# Patient Record
Sex: Female | Born: 1970 | Race: White | Hispanic: No | Marital: Married | Smoking: Never smoker
Health system: Southern US, Community
[De-identification: ages and names within clinical notes are randomized; demographics above are authoritative.]

## PROBLEM LIST (undated history)

## (undated) DIAGNOSIS — R413 Other amnesia: Secondary | ICD-10-CM

## (undated) DIAGNOSIS — J302 Other seasonal allergic rhinitis: Secondary | ICD-10-CM

## (undated) DIAGNOSIS — R011 Cardiac murmur, unspecified: Secondary | ICD-10-CM

## (undated) HISTORY — DX: Other seasonal allergic rhinitis: J30.2

## (undated) HISTORY — PX: LAPAROSCOPY: SHX197

## (undated) HISTORY — PX: KNEE SURGERY: SHX244

## (undated) HISTORY — PX: OTHER SURGICAL HISTORY: SHX169

## (undated) HISTORY — DX: Other amnesia: R41.3

## (undated) HISTORY — PX: ARTHROSCOPIC REPAIR ACL: SUR80

## (undated) HISTORY — PX: TONSILLECTOMY: SHX5217

## (undated) HISTORY — DX: Cardiac murmur, unspecified: R01.1

---

## 1998-02-25 ENCOUNTER — Other Ambulatory Visit: Admission: RE | Admit: 1998-02-25 | Discharge: 1998-02-25 | Payer: Self-pay | Admitting: Obstetrics and Gynecology

## 2000-09-05 ENCOUNTER — Inpatient Hospital Stay (HOSPITAL_COMMUNITY): Admission: EM | Admit: 2000-09-05 | Discharge: 2000-09-07 | Payer: Self-pay | Admitting: Emergency Medicine

## 2000-09-05 ENCOUNTER — Encounter: Payer: Self-pay | Admitting: Orthopedic Surgery

## 2000-09-05 ENCOUNTER — Encounter: Payer: Self-pay | Admitting: Emergency Medicine

## 2004-06-12 ENCOUNTER — Encounter: Admission: RE | Admit: 2004-06-12 | Discharge: 2004-06-12 | Payer: Self-pay | Admitting: Family Medicine

## 2008-08-23 ENCOUNTER — Ambulatory Visit: Payer: Self-pay | Admitting: Internal Medicine

## 2008-08-23 ENCOUNTER — Ambulatory Visit: Payer: Self-pay | Admitting: Infectious Diseases

## 2008-08-23 ENCOUNTER — Observation Stay (HOSPITAL_COMMUNITY): Admission: EM | Admit: 2008-08-23 | Discharge: 2008-08-23 | Payer: Self-pay | Admitting: Emergency Medicine

## 2010-08-02 IMAGING — CT CT ANGIO CHEST
2 of 7 series · 19 of 36 positions shown · IV contrast (APPLIED)
Comparison: Plain film of earlier in the morning.

CLINICAL DATA: Chest pain.  Shortness of breath.

CT Angiography of the Chest.
TECHNIQUE: Multidetector CT angiography of the chest was performed
after contrast with bolus timed to evaluate the pulmonary arteries.
Multiplanar CT image reconstructions including MIPs were obtained
to evaluate the vascular anatomy.
Contrast:  67 ml Omnipaque 350

[Series 8: pulm embolism 1.0 b25f thins · axial · 0.63mm/px · z∈[-276,-38]mm · 18 of 267 slices shown]
[im 14/267  lung]
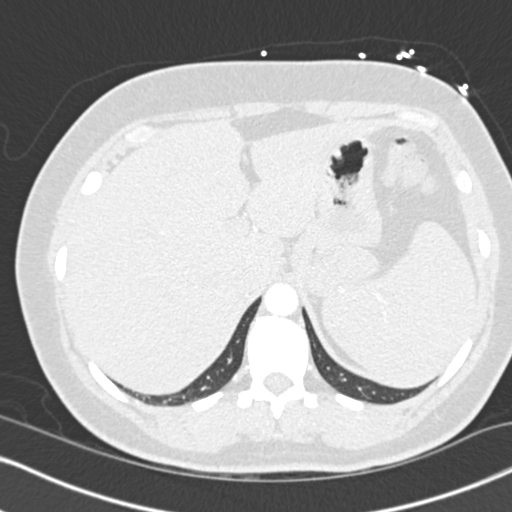
[im 27/267  mediastinal]
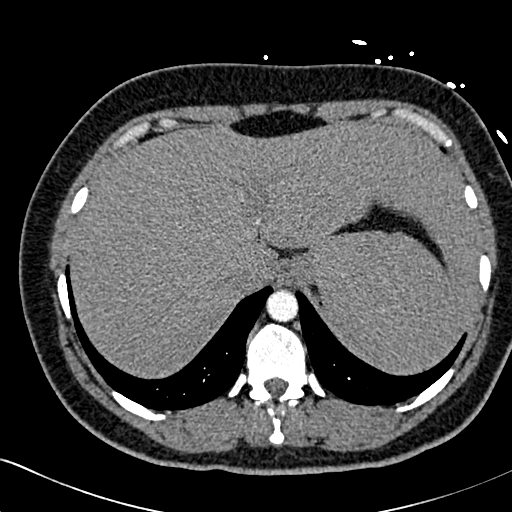
[im 40/267  lung]
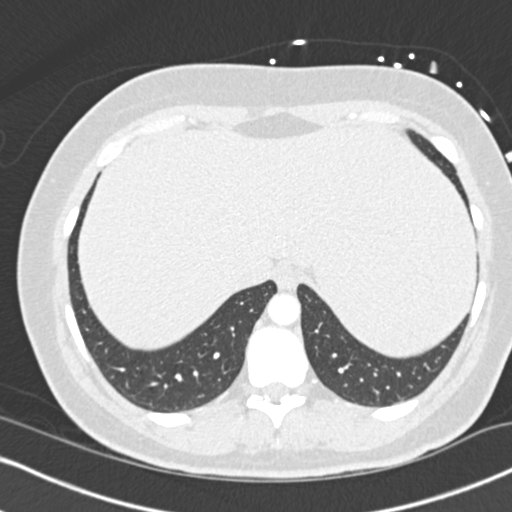
[im 54/267  mediastinal]
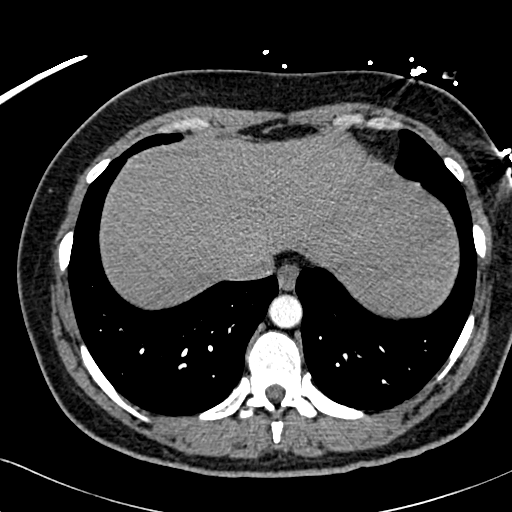
[im 67/267  lung]
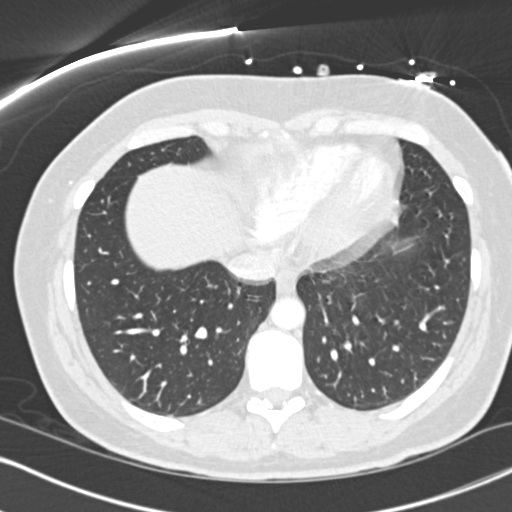
[im 80/267  mediastinal]
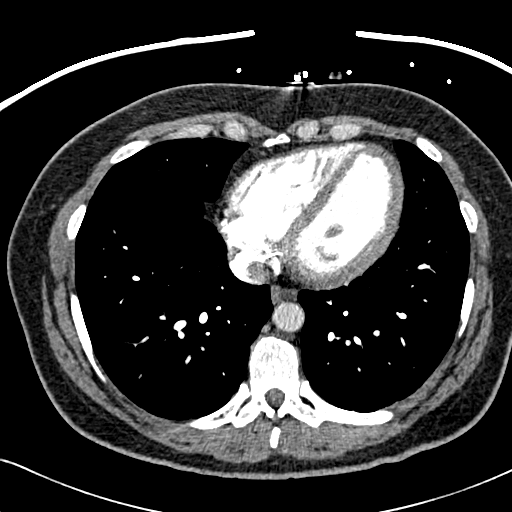
[im 94/267  lung]
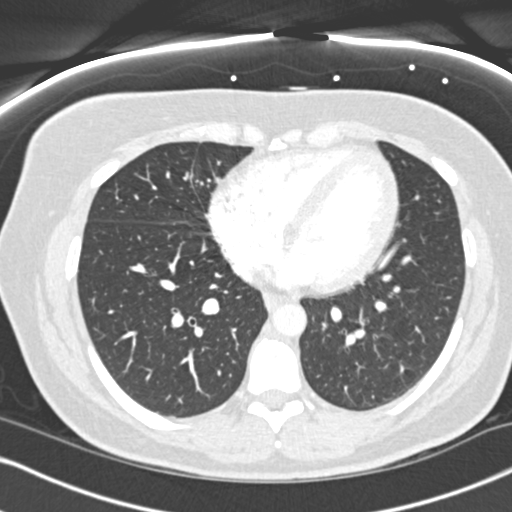
[im 107/267  mediastinal]
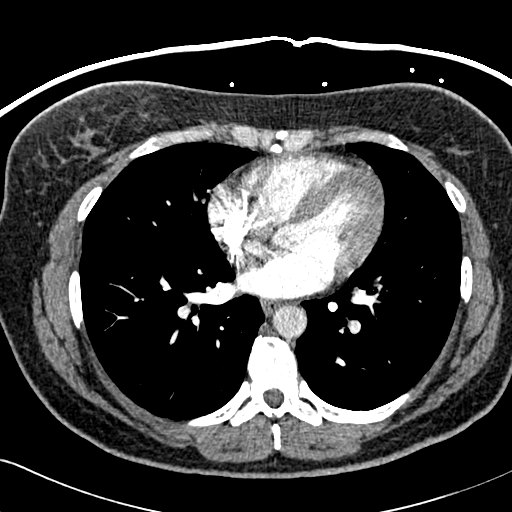
[im 120/267  lung]
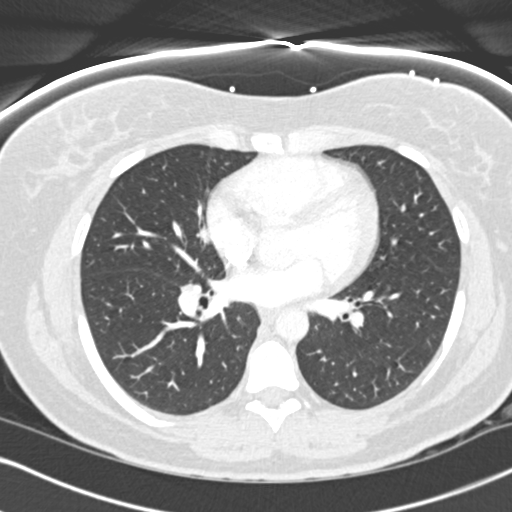
[im 147/267  mediastinal]
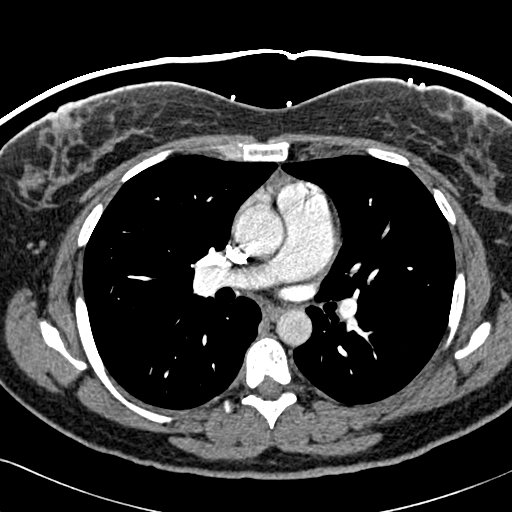
[im 160/267  lung]
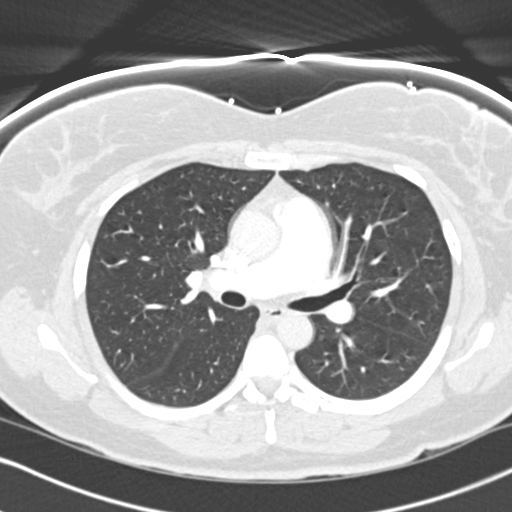
[im 173/267  mediastinal]
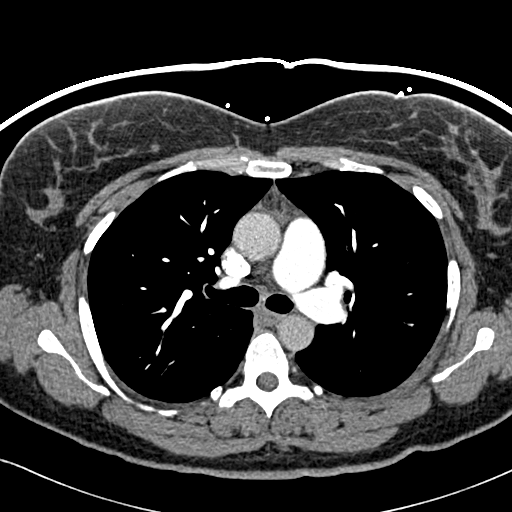
[im 187/267  lung]
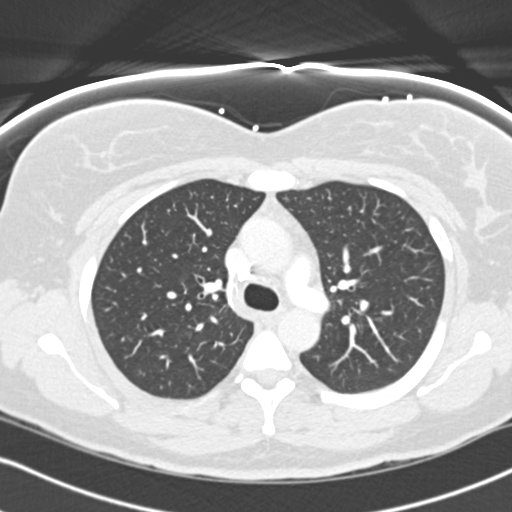
[im 200/267  mediastinal]
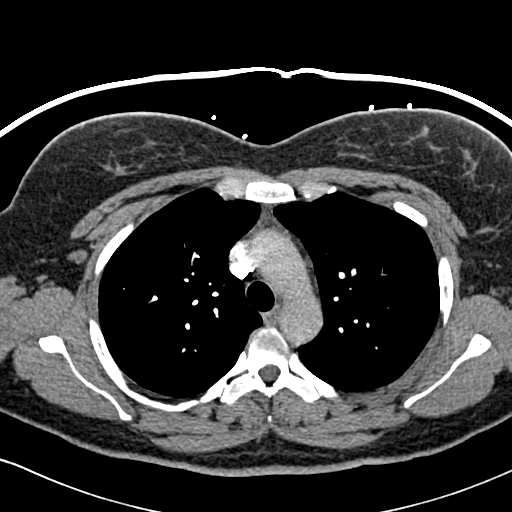
[im 213/267  lung]
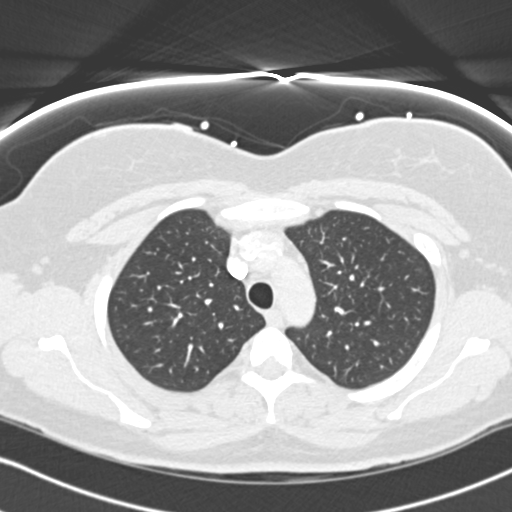
[im 227/267  mediastinal]
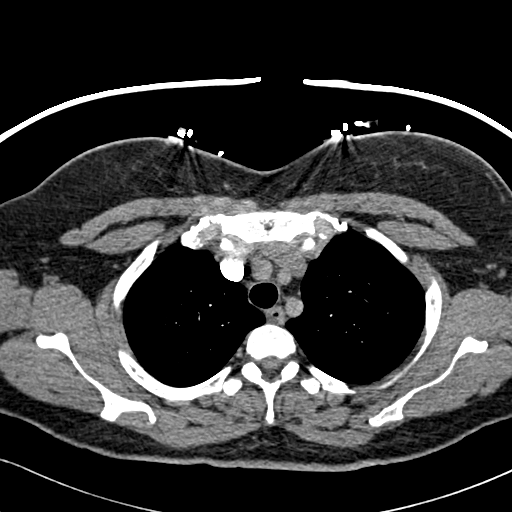
[im 240/267  lung]
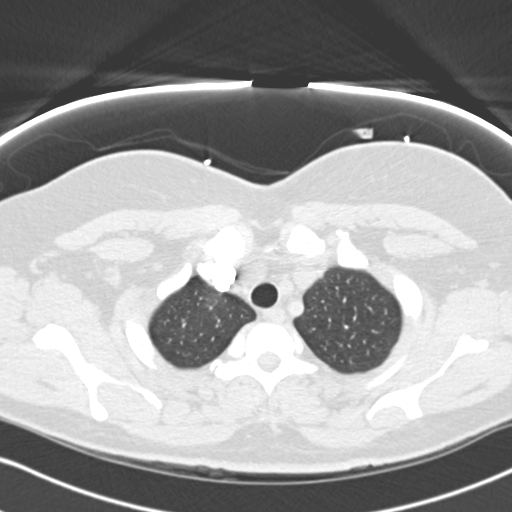
[im 253/267  mediastinal]
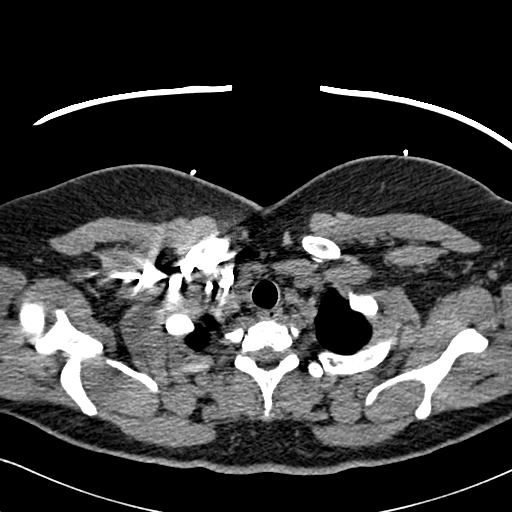

[Series 602: cor · coronal · 0.63mm/px · 1 of 102 slices shown]
[im 51/102  mediastinal]
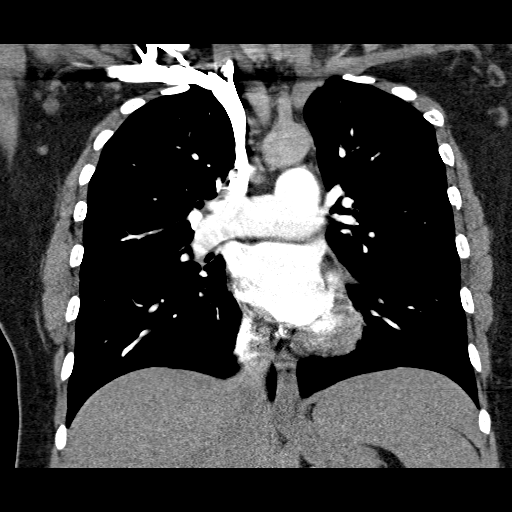

[19 of 36 positions shown; findings below may reference images not displayed]

FINDINGS: Lung windows demonstrate no nodules, airspace opacities.

Soft tissue windows:  The quality of this exam for evaluation of
pulmonary embolism is good.  The upper lobe pulmonary arteries are
extremely well opacified.  The lower lobe pulmonary arteries are
moderately well opacified.  No evidence of upper lobe pulmonary
embolism.  Small segmental emboli cannot be excluded to the lower
lobes.

Normal heart size without pericardial or pleural effusion.  No
mediastinal or hilar adenopathy.

Limited abdominal imaging demonstrates prominent left lobe of the
liver, a normal variant. No acute osseous abnormality.

 Review of the MIP images confirms the above findings.
IMPRESSION: 1. No evidence of pulmonary embolism.  Mildly limited evaluation of
the lower lobes.
2. No acute process in the chest.

## 2010-08-23 LAB — TROPONIN I
Troponin I: <0.01 ng/mL (ref 0.00–0.06)
Troponin I: <0.01 ng/mL (ref 0.00–0.06)

## 2010-08-23 LAB — CBC
HCT: 41.5 % (ref 36.0–46.0)
Hemoglobin: 13.9 g/dL (ref 12.0–15.0)
MCHC: 33.5 g/dL (ref 30.0–36.0)
MCHC: 34.9 g/dL (ref 30.0–36.0)
MCV: 85.5 fL (ref 78.0–100.0)
Platelets: 212 10*3/uL (ref 150–400)
RBC: 4.53 MIL/uL (ref 3.87–5.11)
RBC: 4.86 MIL/uL (ref 3.87–5.11)
WBC: 9.3 10*3/uL (ref 4.0–10.5)

## 2010-08-23 LAB — BASIC METABOLIC PANEL
CO2: 20 mEq/L (ref 19–32)
Chloride: 103 mEq/L (ref 96–112)
Creatinine, Ser: 0.88 mg/dL (ref 0.4–1.2)
GFR calc Af Amer: >60 mL/min (ref >60)
Potassium: 3.5 mEq/L (ref 3.5–5.1)

## 2010-08-23 LAB — POCT CARDIAC MARKERS

## 2010-08-23 LAB — LIPID PANEL
HDL: 33 mg/dL — ABNORMAL LOW (ref >39)
Total CHOL/HDL Ratio: 3.8 RATIO
Triglycerides: 59 mg/dL (ref <150)

## 2010-08-23 LAB — APTT: aPTT: 32 seconds (ref 24–37)

## 2010-08-23 LAB — MAGNESIUM: Magnesium: 1.9 mg/dL (ref 1.5–2.5)

## 2010-08-23 LAB — CK TOTAL AND CKMB (NOT AT ARMC)
Relative Index: INVALID (ref 0.0–2.5)
Total CK: 54 U/L (ref 7–177)

## 2010-08-23 LAB — POCT I-STAT, CHEM 8
Calcium, Ion: 0.99 mmol/L — ABNORMAL LOW (ref 1.12–1.32)
Glucose, Bld: 94 mg/dL (ref 70–99)
HCT: 43 % (ref 36.0–46.0)
TCO2: 18 mmol/L (ref 0–100)

## 2010-08-23 LAB — PROTIME-INR
INR: 1.1 (ref 0.00–1.49)
Prothrombin Time: 14.3 seconds (ref 11.6–15.2)

## 2010-08-23 LAB — D-DIMER, QUANTITATIVE: D-Dimer, Quant: 1.15 ug/mL-FEU — ABNORMAL HIGH (ref 0.00–0.48)

## 2010-09-26 NOTE — H&P (Signed)
NAMEALLURE, GREASER NO.:  [id number]   MEDICAL RECORD NO.:  [id number]          PATIENT TYPE:  INP   LOCATION:  5504                         FACILITY:  [facility]   PHYSICIAN:  Mick Sell, MD DATE OF BIRTH:  [birth year]   DATE OF ADMISSION:  08/22/2008  DATE OF DISCHARGE:                              HISTORY & PHYSICAL   PRIMARY CARE DOCTOR:  [facility].   CHIEF COMPLAINT:  Chest pain.   HISTORY OF PRESENT ILLNESS:  This is a 40 year old white female with no  significant past medical history who is a nonsmoker who was admitted for  chest pain on and off for 1 day.  She says this morning she felt poorly,  but by 4:30 p.m. developed chest pressure 7/10 at the worst, felt like  an elephant sitting on her chest associated with shortness of breath,  worse with exertion and also had some nausea and left arm pain and neck  pain.  She also felt dizzy at 1 point.  The pain was relatively constant  at 5/10 and at its worse was 7/10.  She came to the emergency room where  her chest pain was relieved with sublingual nitroglycerin.  She also had  occasional palpitations she reports.  She has no cough.  No lower  extremity swelling.  She had no recent illnesses except gastroenteritis  4 weeks ago.  She did return 1 week ago from a 14-hour trip to [place]  at [facility].  No sick contacts.   PAST MEDICAL HISTORY:  1. Heart murmur.  2. Knee surgery.  3. She has 2 children who are healthy.   SOCIAL HISTORY:  She lives with her kids and her husband.  She does not  smoke, drinks occasional alcohol and works as a Advertising account planner.   FAMILY HISTORY:  Her daughter has SVT, but other than that, she does not  know her history since she is adopted.   REVIEW OF SYSTEMS:  Eleven systems are reviewed and negative except as  per HPI.   PHYSICAL EXAMINATION:  Temperature 98.8, pulse 55-67, blood pressure  93/56 to 112/64, respirations 18-20, saturating 100%  on room air.  In general, she is a pleasant woman in no acute distress.  Pupils equal,  round and reactive to light and accommodation.  Extraocular movements  are intact.  Sclerae are anicteric.  NECK:  Is supple.  HEART:  Is regular rate and rhythm with no murmurs, rubs or gallops  noted.  LUNGS:  Clear to auscultation bilaterally.  ABDOMEN:  Soft, nontender, nondistended.  No clubbing, cyanosis or  edema.  Negative Homans' sign.  NEUROLOGICALLY:  She is grossly intact  day.   LABORATORY DATA:  The patient had blood work done in the emergency room  with a BMET sodium 132, potassium 3.5, chloride 103, CO2 20, glucose 96,  BUN 16, creatinine 0.88, calcium was 8.4.  Coags were within normal  limits.  CK was 66, MB was 0.7, troponin was less than 0.01.  Magnesium  was 1.9.  BNP was 44.  D-dimer was positive at 1.15.  CBC with white  count of 10.6, hemoglobin 13.9, platelets 223.  Chest x-ray was done and  revealed no acute abnormality.  CT of her chest is pending.  EKG was  normal sinus rhythm with normal axis and intervals.   IMPRESSION:  This is a 40 year old female with no past medical history  with acute onset of exertional chest pain, relieved when she came to the  emergency room with sublingual nitroglycerin.  She has no cardiac risk  factors, but her family history is unknown.  She did have a recent  prolonged car trip and has an elevated D-dimer.   PLAN:  1. Will admit her to telemetry and rule her out for an MI with cardiac      enzymes.  Will check a CT angio to evaluate for pulmonary embolism.      Will place her on aspirin and sublingual nitro p.r.n.  We will hold      on beta blocker given that her blood pressure is borderline.  We      may consider cardiology consult in the morning given the      possibility of coronary artery disease.  2. Place her on Lovenox for DVT prophylaxis.  3. Disposition.  The patient can return home once stable.      Mick Sell,  MD  Electronically Signed     DPF/MEDQ  D:  08/23/2008  T:  08/23/2008  Job:  [id number]
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
## 2011-05-21 ENCOUNTER — Ambulatory Visit: Payer: Self-pay | Admitting: Gynecology

## 2011-10-10 ENCOUNTER — Ambulatory Visit: Payer: BC Managed Care – PPO | Admitting: Obstetrics and Gynecology

## 2011-10-16 ENCOUNTER — Encounter: Payer: Self-pay | Admitting: Obstetrics and Gynecology

## 2011-10-16 ENCOUNTER — Ambulatory Visit (INDEPENDENT_AMBULATORY_CARE_PROVIDER_SITE_OTHER): Payer: BC Managed Care – PPO | Admitting: Obstetrics and Gynecology

## 2011-10-16 VITALS — BP 100/70 | HR 74 | Ht 68.0 in | Wt 205.0 lb

## 2011-10-16 DIAGNOSIS — Z124 Encounter for screening for malignant neoplasm of cervix: Secondary | ICD-10-CM

## 2011-10-16 DIAGNOSIS — N946 Dysmenorrhea, unspecified: Secondary | ICD-10-CM

## 2011-10-16 DIAGNOSIS — Z01419 Encounter for gynecological examination (general) (routine) without abnormal findings: Secondary | ICD-10-CM

## 2011-10-16 MED ORDER — IBUPROFEN 800 MG PO TABS: 800.0000 mg | ORAL_TABLET | Freq: Three times a day (TID) | ORAL | Status: AC | PRN

## 2011-10-16 NOTE — Progress Notes (Signed)
Regular Periods: yes Mammogram: yes  Monthly Breast Ex.: yes Exercise: yes  Tetanus < 10 years: no Seatbelts: yes  NI. Bladder Functn.: yes Abuse at home: no  Daily BM's: yes Stressful Work: yes  Healthy Diet: yes Sigmoid-Colonoscopy: NO  Calcium: no Medical problems this year: NO PROBLEMS   LAST PAP:2 YEARS AGO NL  Contraception: SPOUSE HAS VAS.  Mammogram:  9/12 MARKER IN RIGHT BREAST  PCP: NOELLE REDMON  PMH: NO CHANGE  [facility]: NO CHANGE  Last Bone Scan: NO   PT IS ADOPTED AND MARRIED

## 2011-10-16 NOTE — Progress Notes (Signed)
Subjective:    Stacy Mclaughlin is a 41 y.o. female, G3P2013, who presents for an annual exam. The patient reports no complaints.   Menstrual cycle:   LMP: Patient's last menstrual period was 10/12/2011.             Review of Systems Pertinent items are noted in HPI. Denies pelvic pain, urinary tract symptoms, vaginitis symptoms, irregular bleeding, menopausal symptoms, change in bowel habits or rectal bleeding   Objective:    BP 100/70  Pulse 74  Ht 5\' 8"  (1.727 m)  Wt 205 lb (92.987 kg)  BMI 31.17 kg/m2  LMP 10/12/2011   @Weigh @t :  Wt Readings from Last 1 Encounters:  10/16/11 205 lb (92.987 kg)   Body mass index is 31.17 kg/(m^2). General Appearance: Alert, no acute distress HEENT: Grossly normal Neck / Thyroid: Supple, no thyromegaly or cervical adenopathy Lungs: Clear to auscultation bilaterally Back: No CVA tenderness Breast Exam: No masses or nodes.No dimpling, nipple retraction or discharge. Cardiovascular: Regular rate and rhythm.  Gastrointestinal: Soft, non-tender, no masses or organomegaly Pelvic Exam: EGBUS-wnl, vagina-normal rugae, cervix- without lesions or tenderness, uterus appears normal size shape and consistency, adnexae-no masses or tenderness Rectovaginal: no masses and normal sphincter tone Lymphatic Exam: Non-palpable nodes in neck, clavicular,  axillary, or inguinal regions  Skin: no rashes or abnormalities Extremities: no clubbing cyanosis or edema  Neurologic: grossly normal Psychiatric: Alert and oriented   Assessment:   Routine GYN Exam Dysmenorrhea   Plan:  Ibuprofen 800 mg # 30 1 po pc q 8 hours prn-pain  2 refills  Calcium/Magnesium/Vitamin D daily  PAP sent  RTO 1 year or prn  Jerimah Witucki,ELMIRAPA-C

## 2011-10-16 NOTE — Patient Instructions (Signed)
Take Calcium with Magnesium and Vitamin D

## 2011-10-18 LAB — PAP IG W/ RFLX HPV ASCU

## 2013-07-30 ENCOUNTER — Other Ambulatory Visit: Payer: Self-pay | Admitting: Family Medicine

## 2013-07-30 ENCOUNTER — Other Ambulatory Visit: Payer: Self-pay | Admitting: Physician Assistant

## 2013-07-30 ENCOUNTER — Ambulatory Visit
Admission: RE | Admit: 2013-07-30 | Discharge: 2013-07-30 | Disposition: A | Discharge status: Home / Self Care | Payer: BC Managed Care – PPO | Source: Ambulatory Visit | Attending: Family Medicine | Admitting: Family Medicine

## 2013-07-30 DIAGNOSIS — S0990XA Unspecified injury of head, initial encounter: Secondary | ICD-10-CM

## 2013-07-30 DIAGNOSIS — R51 Headache: Principal | ICD-10-CM

## 2013-07-30 DIAGNOSIS — R519 Headache, unspecified: Secondary | ICD-10-CM

## 2014-03-15 ENCOUNTER — Encounter: Payer: Self-pay | Admitting: Obstetrics and Gynecology

## 2014-08-11 ENCOUNTER — Encounter: Payer: Self-pay | Admitting: Neurology

## 2014-08-17 ENCOUNTER — Encounter: Payer: Self-pay | Admitting: Neurology

## 2014-08-17 ENCOUNTER — Ambulatory Visit (INDEPENDENT_AMBULATORY_CARE_PROVIDER_SITE_OTHER): Payer: BC Managed Care – PPO | Admitting: Neurology

## 2014-08-17 VITALS — BP 118/70 | HR 70 | Temp 98.6°F | Resp 18 | Ht 68.0 in | Wt 220.8 lb

## 2014-08-17 DIAGNOSIS — R413 Other amnesia: Secondary | ICD-10-CM | POA: Diagnosis not present

## 2014-08-17 DIAGNOSIS — Z87820 Personal history of traumatic brain injury: Secondary | ICD-10-CM
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
## 2014-08-17 NOTE — Patient Instructions (Signed)
You scored very well on your cognitive exam.  Physical exam is normal.  I don't appreciate any cognitive impairment.  If it is a possible issue of trouble focusing or due to having too much on your plate, consider seeing a psychiatrist to treat that.  If symptoms persist or get worse, you may follow up in one year for re-evaluation.

## 2015-07-09 IMAGING — CT CT HEAD W/O CM
2 series · 16 of 30 positions shown, 18 images · non-contrast
Comparison: None.

CLINICAL DATA: Pain post trauma

EXAM:
CT HEAD WITHOUT CONTRAST
TECHNIQUE: Contiguous axial images were obtained from the base of the skull
through the vertex without intravenous contrast.

[Series 3: head bone · axial · 0.49mm/px · z∈[+31,+159]mm · 8 of 64 slices shown]
[im 7/64  bone]
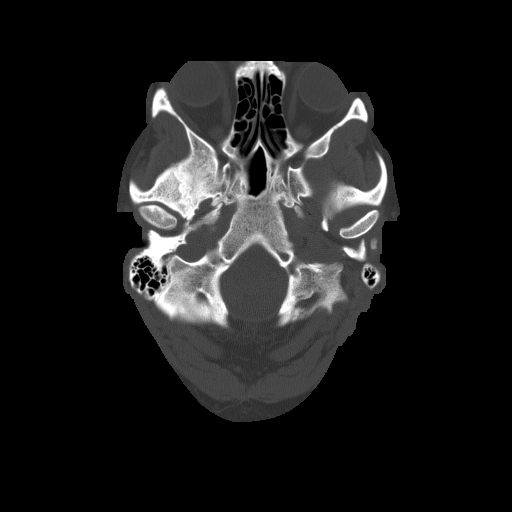
[im 14/64  bone]
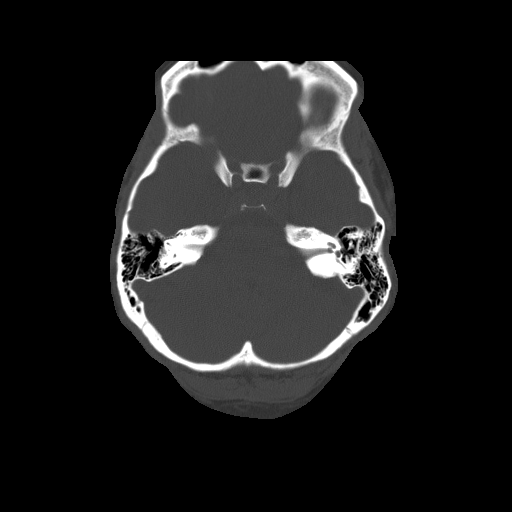
[im 20/64  bone]
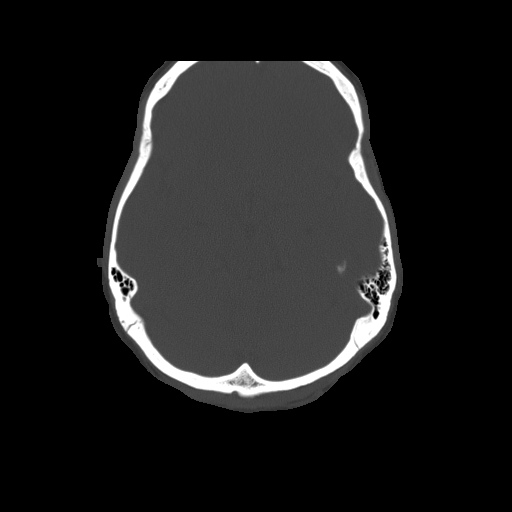
[im 27/64  bone]
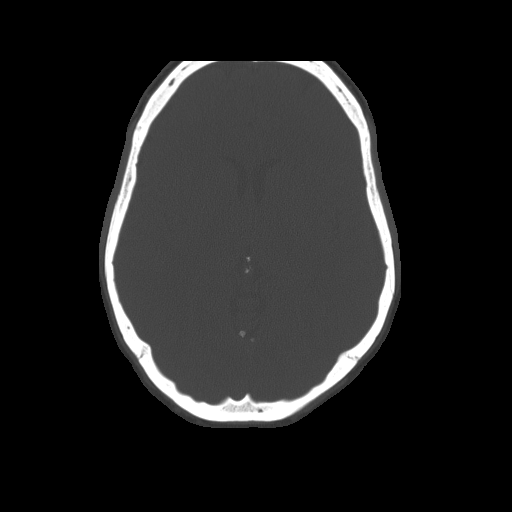
[im 37/64  bone]
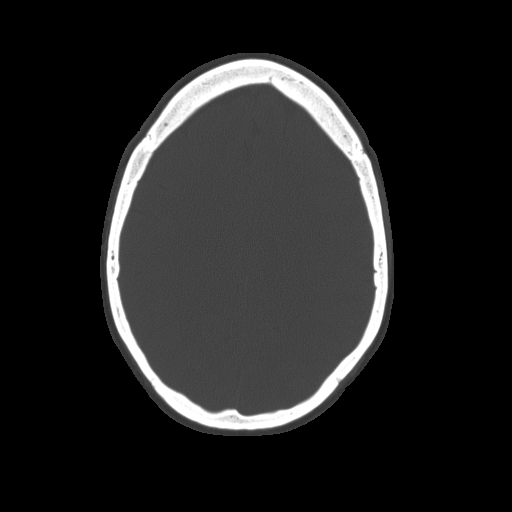
[im 44/64  bone]
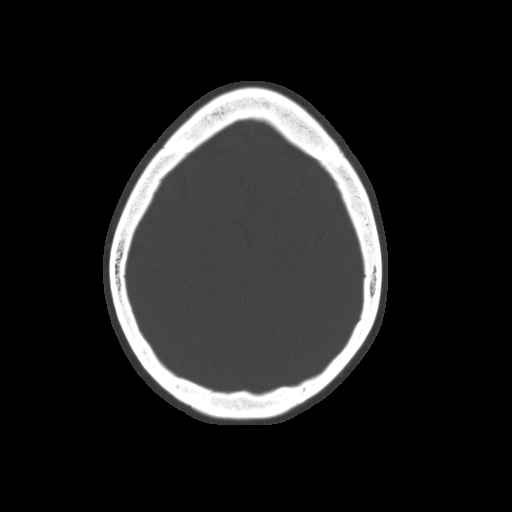
[im 50/64  bone]
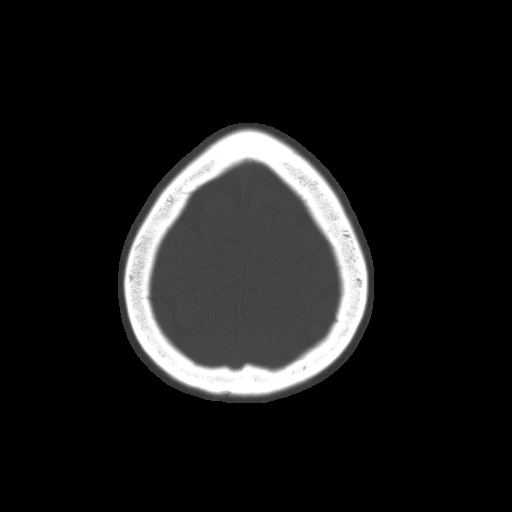
[im 57/64  bone]
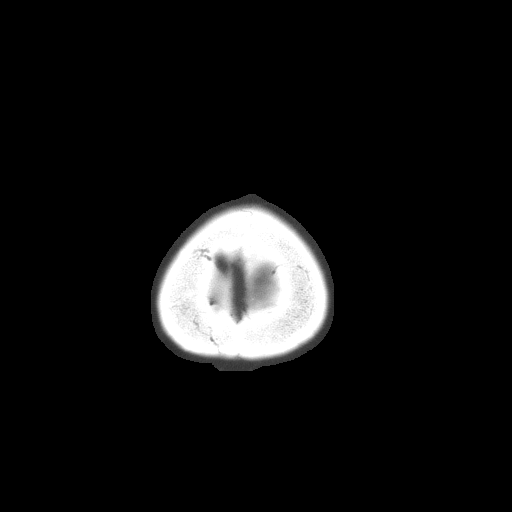

[Series 32: 3d filtered head w/o · axial · non-contrast · 0.49mm/px · z∈[+32,+155]mm · 8 of 32 slices shown, 10 images]
[im 4/32  brain]
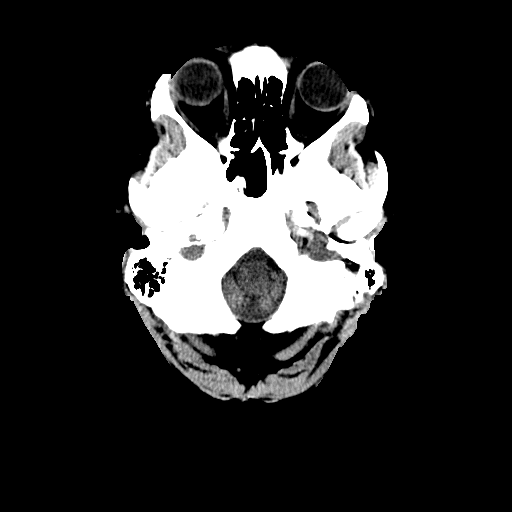
[im 4/32  bone]
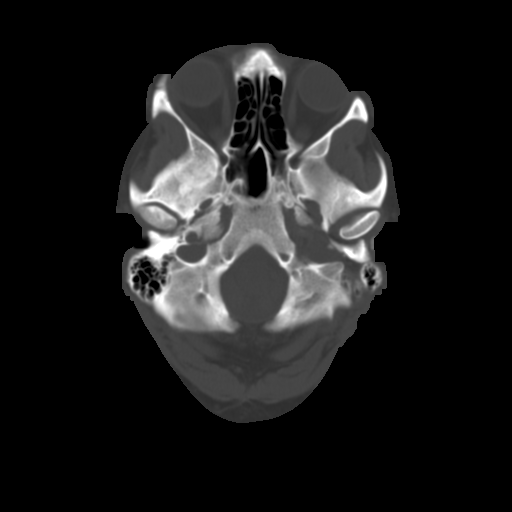
[im 7/32  brain]
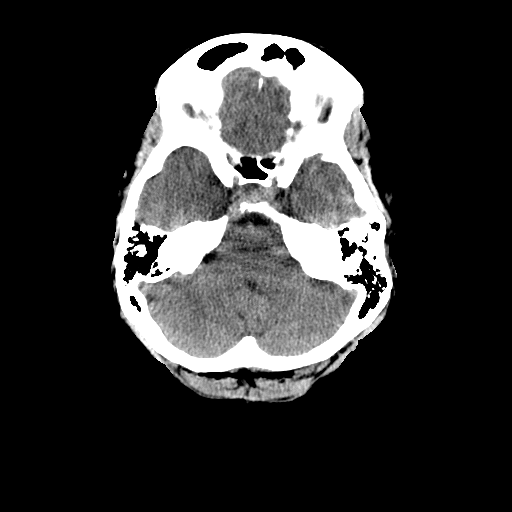
[im 11/32  brain]
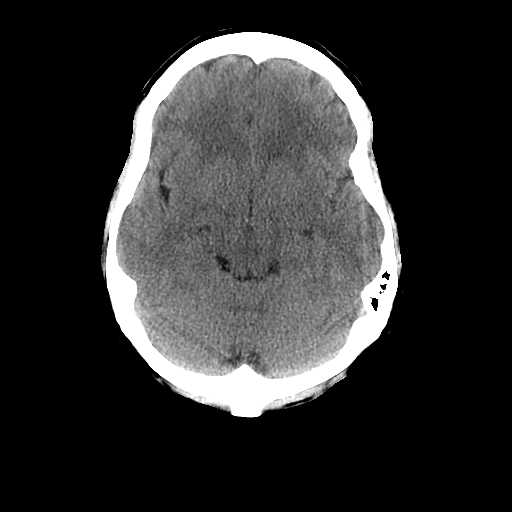
[im 14/32  brain]
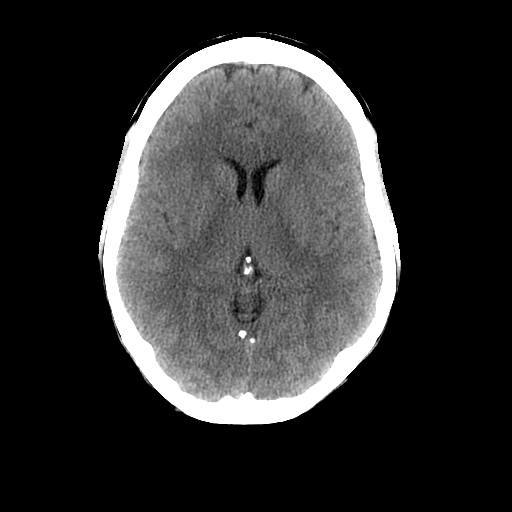
[im 18/32  brain]
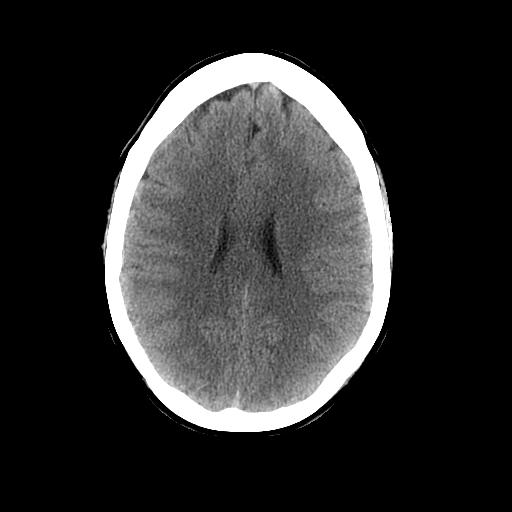
[im 18/32  bone]
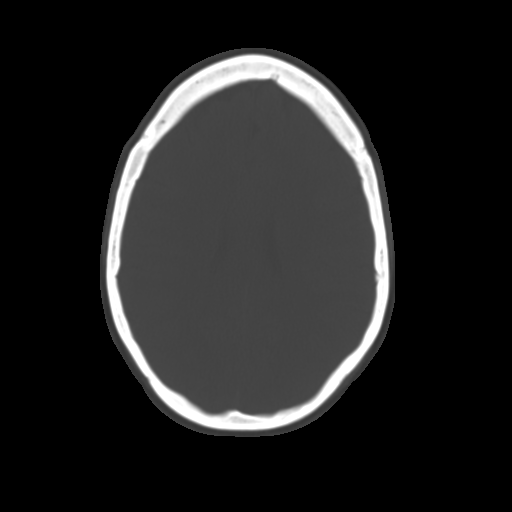
[im 21/32  brain]
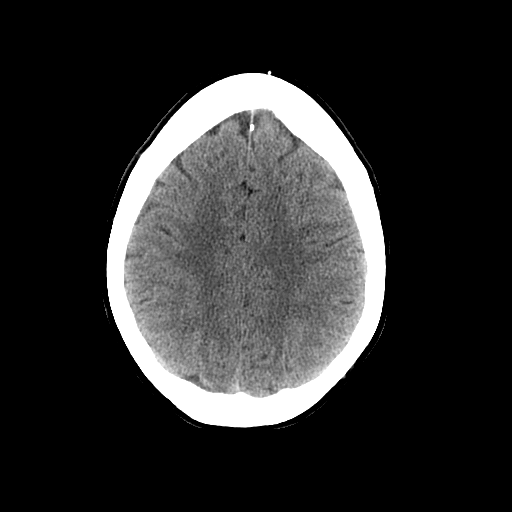
[im 25/32  brain]
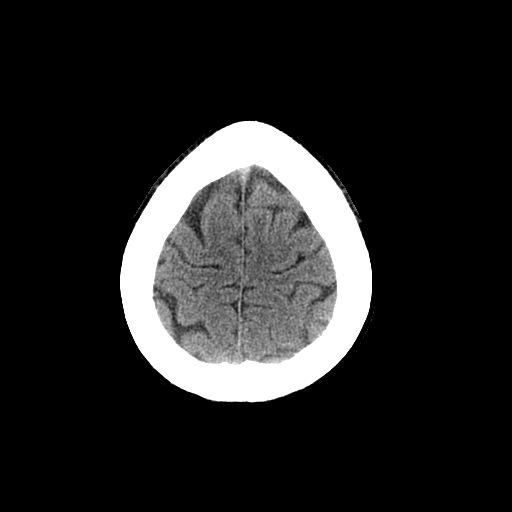
[im 28/32  brain]
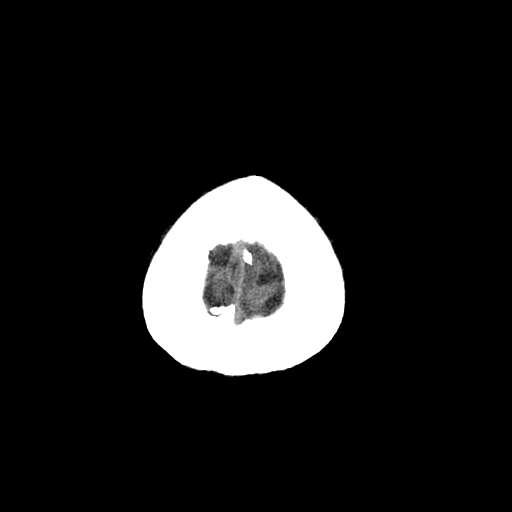

[16 of 30 positions shown; findings below may reference images not displayed]

FINDINGS: The ventricles are normal in size and configuration. There is no
mass, hemorrhage, extra-axial fluid collection, or midline shift.
Gray-white compartments appear normal. There is no demonstrable
acute infarct. There are a few calcifications along the tentorium
which appear benign and could represent small granulomas. Bony
calvarium appears intact. The mastoid air cells are clear.
IMPRESSION: A few small calcifications along the tentorium have a benign
appearance and are not felt to have clinical significance. Study
otherwise unremarkable. In particular, no evidence of intracranial
mass, hemorrhage, edema, or extra-axial fluid.

## 2016-07-18 DIAGNOSIS — R921 Mammographic calcification found on diagnostic imaging of breast: Secondary | ICD-10-CM | POA: Insufficient documentation

## 2016-11-13 ENCOUNTER — Ambulatory Visit: Payer: Self-pay | Admitting: Medical

## 2016-11-13 ENCOUNTER — Encounter: Payer: Self-pay | Admitting: Medical

## 2016-11-13 VITALS — BP 110/78 | HR 67 | Temp 98.0°F | Resp 16 | Ht 68.0 in | Wt 210.0 lb

## 2016-11-13 DIAGNOSIS — R42 Dizziness and giddiness: Secondary | ICD-10-CM

## 2016-11-13 LAB — POCT URINALYSIS DIPSTICK
BILIRUBIN UA: NEGATIVE
Blood, UA: NEGATIVE
Glucose, UA: NEGATIVE
KETONES UA: NEGATIVE
LEUKOCYTES UA: NEGATIVE
Nitrite, UA: NEGATIVE
PH UA: 6 (ref 5.0–8.0)
Protein, UA: NEGATIVE
Spec Grav, UA: 1.025 (ref 1.010–1.025)
Urobilinogen, UA: 0.2 E.U./dL

## 2016-11-13 LAB — POCT URINE PREGNANCY: PREG TEST UR: NEGATIVE

## 2016-11-13 MED ORDER — MECLIZINE HCL 25 MG PO TABS
25.0000 mg | ORAL_TABLET | Freq: Three times a day (TID) | ORAL | 0 refills | Status: DC | PRN
Start: 2016-11-13 — End: 2017-03-27

## 2016-11-13 MED ORDER — MECLIZINE HCL 32 MG PO TABS: 32.0000 mg | ORAL_TABLET | Freq: Three times a day (TID) | ORAL | 0 refills | Status: DC | PRN
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
## 2016-11-13 NOTE — Patient Instructions (Signed)
Dizziness Dizziness is a common problem. It is a feeling of unsteadiness or light-headedness. You may feel like you are about to faint. Dizziness can lead to injury if you stumble or fall. Anyone can become dizzy, but dizziness is more common in older adults. This condition can be caused by a number of things, including medicines, dehydration, or illness. Follow these instructions at home: Taking these steps may help with your condition: Eating and drinking  Drink enough fluid to keep your urine clear or pale yellow. This helps to keep you from becoming dehydrated. Try to drink more clear fluids, such as water.  Do not drink alcohol.  Limit your caffeine intake if directed by your health care provider.  Limit your salt intake if directed by your health care provider. Activity  Avoid making quick movements. ? Rise slowly from chairs and steady yourself until you feel okay. ? In the morning, first sit up on the side of the bed. When you feel okay, stand slowly while you hold onto something until you know that your balance is fine.  Move your legs often if you need to stand in one place for a long time. Tighten and relax your muscles in your legs while you are standing.  Do not drive or operate heavy machinery if you feel dizzy.  Avoid bending down if you feel dizzy. Place items in your home so that they are easy for you to reach without leaning over. Lifestyle  Do not use any tobacco products, including cigarettes, chewing tobacco, or electronic cigarettes. If you need help quitting, ask your health care provider.  Try to reduce your stress level, such as with yoga or meditation. Talk with your health care provider if you need help. General instructions  Watch your dizziness for any changes.  Take medicines only as directed by your health care provider. Talk with your health care provider if you think that your dizziness is caused by a medicine that you are taking.  Tell a friend or  a family member that you are feeling dizzy. If he or she notices any changes in your behavior, have this person call your health care provider.  Keep all follow-up visits as directed by your health care provider. This is important. Contact a health care provider if:  Your dizziness does not go away.  Your dizziness or light-headedness gets worse.  You feel nauseous.  You have reduced hearing.  You have new symptoms.  You are unsteady on your feet or you feel like the room is spinning. Get help right away if:  You vomit or have diarrhea and are unable to eat or drink anything.  You have problems talking, walking, swallowing, or using your arms, hands, or legs.  You feel generally weak.  You are not thinking clearly or you have trouble forming sentences. It may take a friend or family member to notice this.  You have chest pain, abdominal pain, shortness of breath, or sweating.  Your vision changes.  You notice any bleeding.  You have a headache.  You have neck pain or a stiff neck.  You have a fever. This information is not intended to replace advice given to you by your health care provider. Make sure you discuss any questions you have with your health care provider. Document Released: 10/24/2000 Document Revised: 10/06/2015 Document Reviewed: 04/26/2014 Elsevier Interactive Patient Education  2017 [employer]  Seek out Urgent care if symptoms worsen.  Otherwise follow up on Thursday. Push fluids like water  and gatorade.

## 2016-11-14 LAB — COMPREHENSIVE METABOLIC PANEL
ALK PHOS: 84 IU/L (ref 39–117)
ALT: 12 IU/L (ref 0–32)
AST: 12 IU/L (ref 0–40)
Albumin/Globulin Ratio: 1.5 (ref 1.2–2.2)
Albumin: 4.2 g/dL (ref 3.5–5.5)
BUN/Creatinine Ratio: 12 (ref 9–23)
BUN: 13 mg/dL (ref 6–24)
Bilirubin Total: 0.4 mg/dL (ref 0.0–1.2)
CO2: 25 mmol/L (ref 20–29)
CREATININE: 1.07 mg/dL — AB (ref 0.57–1.00)
Calcium: 9.5 mg/dL (ref 8.7–10.2)
Chloride: 100 mmol/L (ref 96–106)
GFR calc Af Amer: 71 mL/min/{1.73_m2} (ref >59)
GFR calc non Af Amer: 62 mL/min/{1.73_m2} (ref >59)
GLOBULIN, TOTAL: 2.8 g/dL (ref 1.5–4.5)
GLUCOSE: 93 mg/dL (ref 65–99)
Potassium: 4.4 mmol/L (ref 3.5–5.2)
SODIUM: 140 mmol/L (ref 134–144)
Total Protein: 7 g/dL (ref 6.0–8.5)

## 2016-11-14 LAB — CBC WITH DIFFERENTIAL/PLATELET
BASOS ABS: 0 10*3/uL (ref 0.0–0.2)
Basos: 0 %
EOS (ABSOLUTE): 0.2 10*3/uL (ref 0.0–0.4)
EOS: 2 %
HEMATOCRIT: 38.2 % (ref 34.0–46.6)
Hemoglobin: 12.6 g/dL (ref 11.1–15.9)
IMMATURE GRANULOCYTES: 0 %
Immature Grans (Abs): 0 10*3/uL (ref 0.0–0.1)
Lymphocytes Absolute: 2.3 10*3/uL (ref 0.7–3.1)
Lymphs: 20 %
MCH: 28 pg (ref 26.6–33.0)
MCHC: 33 g/dL (ref 31.5–35.7)
MCV: 85 fL (ref 79–97)
MONOS ABS: 0.9 10*3/uL (ref 0.1–0.9)
Monocytes: 8 %
NEUTROS PCT: 70 %
Neutrophils Absolute: 7.8 10*3/uL — ABNORMAL HIGH (ref 1.4–7.0)
PLATELETS: 327 10*3/uL (ref 150–379)
RBC: 4.5 x10E6/uL (ref 3.77–5.28)
RDW: 13.7 % (ref 12.3–15.4)
WBC: 11.3 10*3/uL — AB (ref 3.4–10.8)

## 2016-11-14 LAB — THYROID PANEL WITH TSH
Free Thyroxine Index: 1.8 (ref 1.2–4.9)
T3 UPTAKE RATIO: 27 % (ref 24–39)
T4 TOTAL: 6.6 ug/dL (ref 4.5–12.0)
TSH: 1.77 u[IU]/mL (ref 0.450–4.500)

## 2016-11-15 ENCOUNTER — Encounter: Payer: Self-pay | Admitting: Medical

## 2016-11-15 ENCOUNTER — Ambulatory Visit: Payer: Self-pay | Admitting: Medical

## 2016-11-15 VITALS — BP 90/60 | HR 65 | Temp 99.1°F | Resp 16

## 2016-11-15 DIAGNOSIS — J0100 Acute maxillary sinusitis, unspecified: Secondary | ICD-10-CM

## 2016-11-15 MED ORDER — AMOXICILLIN 875 MG PO TABS: 875.0000 mg | ORAL_TABLET | Freq: Two times a day (BID) | ORAL | 0 refills | Status: DC

## 2016-11-15 NOTE — Progress Notes (Signed)
   Subjective:    Patient ID: Stacy Mclaughlin, female    DOB: 05/20/1970, 46 y.o.   MRN: [id number]  HPI 46 yo returning for recheck of dizziness and feeling better today.  However yesterday had maxillary sinus pressure. History of sinusitis. Nothing recent.  Meclizine helped, nausea resolved and dizziness decreased but made patient very sleepy. Dizzy with getting up sometimes , but improved from 11/13/16 visit. With bending over she feels facial pressure in th maxillary area.   Review of Systems  Constitutional: Negative for fatigue and fever.  HENT: Positive for congestion and sinus pressure. Negative for ear pain, sinus pain and sore throat.   Eyes: Negative for discharge and itching.  Respiratory: Negative for cough.   Cardiovascular: Negative for chest pain.  Gastrointestinal: Negative for abdominal pain.  Genitourinary: Negative for hematuria.  Musculoskeletal: Negative for myalgias.  Skin: Negative for rash.  Neurological: Positive for dizziness. Negative for syncope and light-headedness.  Hematological: Negative for adenopathy.       Objective:   Physical Exam  Constitutional: She is oriented to person, place, and time. She appears well-developed and well-nourished.  HENT:  Head: Normocephalic and atraumatic.  Right Ear: Hearing, tympanic membrane, external ear and ear canal normal.  Left Ear: Hearing, tympanic membrane, external ear and ear canal normal.  Nose: Mucosal edema present. Right sinus exhibits maxillary sinus tenderness. Left sinus exhibits maxillary sinus tenderness.  Mouth/Throat: Uvula is midline, oropharynx is clear and moist and mucous membranes are normal.  Eyes: Conjunctivae and EOM are normal. Pupils are equal, round, and reactive to light.  Neck: Normal range of motion.  Cardiovascular: Normal rate, regular rhythm and normal heart sounds.  Exam reveals no gallop and no friction rub.   No murmur heard. Pulmonary/Chest: Effort normal and breath sounds  normal.  Musculoskeletal: Normal range of motion.  Neurological: She is alert and oriented to person, place, and time.  Skin: Skin is warm and dry.  Psychiatric: She has a normal mood and affect. Her behavior is normal. Judgment and thought content normal.  Nursing note and vitals reviewed.     CBC w/diff - elevated WBC count    Assessment & Plan:  Sinusitis e-prescribed Amoxil 875mg  one by mouth twice daily for  10 days #20 no refills. Dizziness improved. Return to the clinic in  3 to 5 days if not improving.

## 2016-11-15 NOTE — Patient Instructions (Signed)

## 2017-01-22 DIAGNOSIS — R921 Mammographic calcification found on diagnostic imaging of breast: Secondary | ICD-10-CM | POA: Diagnosis not present

## 2017-03-27 ENCOUNTER — Encounter: Payer: Self-pay | Admitting: Medical

## 2017-03-27 ENCOUNTER — Ambulatory Visit: Payer: Self-pay | Admitting: Medical

## 2017-03-27 VITALS — BP 118/74 | HR 48 | Temp 98.3°F | Ht 68.0 in | Wt 220.0 lb

## 2017-03-27 DIAGNOSIS — J0100 Acute maxillary sinusitis, unspecified: Secondary | ICD-10-CM

## 2017-03-27 DIAGNOSIS — R05 Cough: Secondary | ICD-10-CM

## 2017-03-27 DIAGNOSIS — R059 Cough, unspecified: Secondary | ICD-10-CM

## 2017-03-27 DIAGNOSIS — J069 Acute upper respiratory infection, unspecified: Secondary | ICD-10-CM

## 2017-03-27 MED ORDER — AMOXICILLIN-POT CLAVULANATE 875-125 MG PO TABS: 1.0000 | ORAL_TABLET | Freq: Two times a day (BID) | ORAL | 0 refills | Status: DC

## 2017-03-27 MED ORDER — BENZONATATE 100 MG PO CAPS: 100.0000 mg | ORAL_CAPSULE | Freq: Three times a day (TID) | ORAL | 0 refills | Status: DC | PRN

## 2017-03-27 NOTE — Patient Instructions (Signed)
Upper Respiratory Infection, Adult Most upper respiratory infections (URIs) are caused by a virus. A URI affects the nose, throat, and upper air passages. The most common type of URI is often called "the common cold." Follow these instructions at home:  Take medicines only as told by your doctor.  Gargle warm saltwater or take cough drops to comfort your throat as told by your doctor.  Use a warm mist humidifier or inhale steam from a shower to increase air moisture. This may make it easier to breathe.  Drink enough fluid to keep your pee (urine) clear or pale yellow.  Eat soups and other clear broths.  Have a healthy diet.  Rest as needed.  Go back to work when your fever is gone or your doctor says it is okay. ? You may need to stay home longer to avoid giving your URI to others. ? You can also wear a face mask and wash your hands often to prevent spread of the virus.  Use your inhaler more if you have asthma.  Do not use any tobacco products, including cigarettes, chewing tobacco, or electronic cigarettes. If you need help quitting, ask your doctor. Contact a doctor if:  You are getting worse, not better.  Your symptoms are not helped by medicine.  You have chills.  You are getting more short of breath.  You have brown or red mucus.  You have yellow or brown discharge from your nose.  You have pain in your face, especially when you bend forward.  You have a fever.  You have puffy (swollen) neck glands.  You have pain while swallowing.  You have white areas in the back of your throat. Get help right away if:  You have very bad or constant: ? Headache. ? Ear pain. ? Pain in your forehead, behind your eyes, and over your cheekbones (sinus pain). ? Chest pain.  You have long-lasting (chronic) lung disease and any of the following: ? Wheezing. ? Long-lasting cough. ? Coughing up blood. ? A change in your usual mucus.  You have a stiff neck.  You have  changes in your: ? Vision. ? Hearing. ? Thinking. ? Mood. This information is not intended to replace advice given to you by your health care provider. Make sure you discuss any questions you have with your health care provider. Document Released: 10/17/2007 Document Revised: 01/01/2016 Document Reviewed: 08/05/2013 Elsevier Interactive Patient Education  2018 [employer] Cough, Adult A cough helps to clear your throat and lungs. A cough may last only 2-3 weeks (acute), or it may last longer than 8 weeks (chronic). Many different things can cause a cough. A cough may be a sign of an illness or another medical condition. Follow these instructions at home:  Pay attention to any changes in your cough.  Take medicines only as told by your doctor. ? If you were prescribed an antibiotic medicine, take it as told by your doctor. Do not stop taking it even if you start to feel better. ? Talk with your doctor before you try using a cough medicine.  Drink enough fluid to keep your pee (urine) clear or pale yellow.  If the air is dry, use a cold steam vaporizer or humidifier in your home.  Stay away from things that make you cough at work or at home.  If your cough is worse at night, try using extra pillows to raise your head up higher while you sleep.  Do not smoke, and try not   to be around smoke. If you need help quitting, ask your doctor.  Do not have caffeine.  Do not drink alcohol.  Rest as needed. Contact a doctor if:  You have new problems (symptoms).  You cough up yellow fluid (pus).  Your cough does not get better after 2-3 weeks, or your cough gets worse.  Medicine does not help your cough and you are not sleeping well.  You have pain that gets worse or pain that is not helped with medicine.  You have a fever.  You are losing weight and you do not know why.  You have night sweats. Get help right away if:  You cough up blood.  You have trouble breathing.  Your  heartbeat is very fast. This information is not intended to replace advice given to you by your health care provider. Make sure you discuss any questions you have with your health care provider. Document Released: 01/11/2011 Document Revised: 10/06/2015 Document Reviewed: 07/07/2014 Elsevier Interactive Patient Education  2018 [employer] Sinusitis, Adult Sinusitis is soreness and inflammation of your sinuses. Sinuses are hollow spaces in the bones around your face. They are located:  Around your eyes.  In the middle of your forehead.  Behind your nose.  In your cheekbones.  Your sinuses and nasal passages are lined with a stringy fluid (mucus). Mucus normally drains out of your sinuses. When your nasal tissues get inflamed or swollen, the mucus can get trapped or blocked so air cannot flow through your sinuses. This lets bacteria, viruses, and funguses grow, and that leads to infection. Follow these instructions at home: Medicines  Take, use, or apply over-the-counter and prescription medicines only as told by your doctor. These may include nasal sprays.  If you were prescribed an antibiotic medicine, take it as told by your doctor. Do not stop taking the antibiotic even if you start to feel better. Hydrate and Humidify  Drink enough water to keep your pee (urine) clear or pale yellow.  Use a cool mist humidifier to keep the humidity level in your home above 50%.  Breathe in steam for 10-15 minutes, 3-4 times a day or as told by your doctor. You can do this in the bathroom while a hot shower is running.  Try not to spend time in cool or dry air. Rest  Rest as much as possible.  Sleep with your head raised (elevated).  Make sure to get enough sleep each night. General instructions  Put a warm, moist washcloth on your face 3-4 times a day or as told by your doctor. This will help with discomfort.  Wash your hands often with soap and water. If there is no soap and water,  use hand sanitizer.  Do not smoke. Avoid being around people who are smoking (secondhand smoke).  Keep all follow-up visits as told by your doctor. This is important. Contact a doctor if:  You have a fever.  Your symptoms get worse.  Your symptoms do not get better within 10 days. Get help right away if:  You have a very bad headache.  You cannot stop throwing up (vomiting).  You have pain or swelling around your face or eyes.  You have trouble seeing.  You feel confused.  Your neck is stiff.  You have trouble breathing. This information is not intended to replace advice given to you by your health care provider. Make sure you discuss any questions you have with your health care provider. Document Released: 10/17/2007 Document Revised:   12/25/2015 Document Reviewed: 02/23/2015 Elsevier Interactive Patient Education  2018 [employer] Inc[employer]  

## 2017-03-27 NOTE — Progress Notes (Signed)
   Subjective:    Patient ID: Stacy Mclaughlin, female    DOB: 02/16/1971, 46 y.o.   MRN: [id number]  HPI 46 yo female in non acute distress complains of  Facial pressure in maxilllary area and cough productive dark green. Denies shortness of breath, no chest pain, or fever / chills. Started with Saturday night with post nasal drip.   Review of Systems  Constitutional: Positive for fatigue (from coughing). Negative for fever.  HENT: Positive for congestion, ear pain (left), nosebleeds (blood tinged nasal drainage with blowing nose), postnasal drip, sinus pressure, sinus pain, sneezing, sore throat and voice change. Negative for rhinorrhea and trouble swallowing.   Eyes: Negative for discharge and itching.  Respiratory: Positive for cough and chest tightness (" little bit"). Negative for shortness of breath.   Cardiovascular: Negative for chest pain, palpitations and leg swelling.  Gastrointestinal: Negative for abdominal pain.  Endocrine: Negative for cold intolerance and heat intolerance.  Genitourinary: Negative for dysuria.  Musculoskeletal: Negative for myalgias.  Skin: Negative for rash.  Allergic/Immunologic: Negative for environmental allergies, food allergies and immunocompromised state.  Neurological: Negative for dizziness, syncope and light-headedness.  Hematological: Negative for adenopathy.  Psychiatric/Behavioral: Negative for behavioral problems, self-injury and suicidal ideas. The patient is not nervous/anxious.        Objective:   Physical Exam  Constitutional: She is oriented to person, place, and time. She appears well-developed and well-nourished.  HENT:  Head: Normocephalic and atraumatic.  Right Ear: Hearing, external ear and ear canal normal. A middle ear effusion is present.  Left Ear: Hearing, external ear and ear canal normal. A middle ear effusion is present.  Nose: Mucosal edema and rhinorrhea present. Right sinus exhibits maxillary sinus tenderness. Right  sinus exhibits no frontal sinus tenderness. Left sinus exhibits maxillary sinus tenderness. Left sinus exhibits no frontal sinus tenderness.  Mouth/Throat: Uvula is midline, oropharynx is clear and moist and mucous membranes are normal.  Cardiovascular: Normal rate and regular rhythm. Exam reveals no gallop and no friction rub.  No murmur heard. Pulmonary/Chest: Effort normal and breath sounds normal.  Neurological: She is alert and oriented to person, place, and time. She has normal reflexes.  Skin: Skin is warm and dry.  Psychiatric: She has a normal mood and affect. Her behavior is normal. Judgment and thought content normal.  Nursing note and vitals reviewed.         Assessment & Plan:   Sinusitis/ Cough/ Upper Respiratory Infection OTC   Claritin or Zyrtec take as directed. Meds ordered this encounter  Medications  . amoxicillin-clavulanate (AUGMENTIN) 875-125 MG tablet    Sig: Take 1 tablet 2 (two) times daily by mouth.    Dispense:  20 tablet    Refill:  0  . benzonatate (TESSALON PERLES) 100 MG capsule    Sig: Take 1 capsule (100 mg total) 3 (three) times daily as needed by mouth for cough.    Dispense:  30 capsule    Refill:  0  return to clinic in 3 -5 days if not improving , Rest and increase fluids. Patient verbalizes understanding and has no questions at discharge.

## 2017-07-23 DIAGNOSIS — R921 Mammographic calcification found on diagnostic imaging of breast: Secondary | ICD-10-CM | POA: Diagnosis not present

## 2017-08-13 DIAGNOSIS — Z6834 Body mass index (BMI) 34.0-34.9, adult: Secondary | ICD-10-CM | POA: Diagnosis not present

## 2017-08-13 DIAGNOSIS — Z124 Encounter for screening for malignant neoplasm of cervix: Secondary | ICD-10-CM | POA: Diagnosis not present

## 2017-08-13 DIAGNOSIS — Z01419 Encounter for gynecological examination (general) (routine) without abnormal findings: Secondary | ICD-10-CM | POA: Diagnosis not present

## 2017-08-13 DIAGNOSIS — N946 Dysmenorrhea, unspecified: Secondary | ICD-10-CM | POA: Diagnosis not present

## 2017-08-15 ENCOUNTER — Ambulatory Visit: Payer: Self-pay | Admitting: Adult Health

## 2017-08-15 ENCOUNTER — Encounter: Payer: Self-pay | Admitting: Adult Health

## 2017-08-15 VITALS — BP 111/73 | HR 64 | Temp 98.5°F | Resp 16 | Ht 68.0 in | Wt 221.0 lb

## 2017-08-15 DIAGNOSIS — H66003 Acute suppurative otitis media without spontaneous rupture of ear drum, bilateral: Secondary | ICD-10-CM

## 2017-08-15 DIAGNOSIS — H60502 Unspecified acute noninfective otitis externa, left ear: Secondary | ICD-10-CM

## 2017-08-15 DIAGNOSIS — J3089 Other allergic rhinitis: Secondary | ICD-10-CM

## 2017-08-15 MED ORDER — CETIRIZINE HCL 10 MG PO TABS: 10.0000 mg | ORAL_TABLET | Freq: Every day | ORAL | 11 refills | Status: DC

## 2017-08-15 MED ORDER — PREDNISONE 10 MG (21) PO TBPK: ORAL_TABLET | ORAL | 0 refills | Status: DC

## 2017-08-15 MED ORDER — AMOXICILLIN 875 MG PO TABS: 875.0000 mg | ORAL_TABLET | Freq: Two times a day (BID) | ORAL | 0 refills | Status: DC

## 2017-08-15 MED ORDER — NEOMYCIN-POLYMYXIN-HC 3.5-10000-1 OT SOLN: 3.0000 [drp] | Freq: Four times a day (QID) | OTIC | 0 refills | Status: DC

## 2017-08-15 NOTE — Progress Notes (Addendum)
Subjective:     Patient ID: Stacy Mclaughlin, female   DOB: [birth year], 47 y.o.   MRN: [id number]  Patient is a 47 year old female in no acute distress who comes in with left ear pain, bilateral ear fullness. She has nasal discharge and post nasal drip.  11/15/16 - sinusitis treated with Augmentin - denies any recurrent of chronic ear infections or sinusitis. She does report she sees Dr. Danice Goltz with ENT to have cerumen removal. She reports her left ear feels like when she use to get " swimmers ear ". Denies any swimming. She reports upcoming cruise for 25 th wedding anniversary and will be flying.   Patient  denies any fever, body aches,chills, rash, chest pain, shortness of breath, nausea, vomiting, or diarrhea.   Blood pressure 111/73, pulse 64, temperature 98.5 F (36.9 C), resp. rate 16, height 5\' 8"  (1.727 m), weight 221 lb (100.2 kg), SpO2 100 %.   No Known Allergies   Patient Active Problem List   Diagnosis Date Noted  . Memory deficits 08/17/2014  . History of concussion 08/17/2014    Current Outpatient Medications:  .  fluticasone (FLONASE) 50 MCG/ACT nasal spray, Place into both nostrils daily., Disp: , Rfl:    No LMP recorded. LMP per patient March 21 st - normal menstrual cycle per patient.   Ear Fullness   There is pain in the left ear. This is a new problem. The current episode started 1 to 4 weeks ago (2 weeks ago ). Episode frequency: on/off pain - " fullness feeling stays in left ear  The problem has been gradually worsening. There has been no fever. The pain is mild (" annoying "). Associated symptoms include headaches (last week - resolved with Ibuprofen and rest ) and rhinorrhea. Pertinent negatives include no abdominal pain, coughing, diarrhea, ear discharge, hearing loss, neck pain, rash, sore throat or vomiting. She has tried NSAIDs for the symptoms. The treatment provided mild relief. There is no history of a chronic ear infection, hearing loss or a tympanostomy tube.        Review of Systems  Constitutional: Negative.   HENT: Positive for ear pain (left ear feels very full/ left and right ear pain ) and rhinorrhea. Negative for congestion, dental problem, drooling, ear discharge, facial swelling, hearing loss, mouth sores, nosebleeds, postnasal drip, sinus pressure, sinus pain, sneezing, sore throat, tinnitus and trouble swallowing.   Eyes: Negative.   Respiratory: Negative for apnea, cough, choking, chest tightness, shortness of breath, wheezing and stridor.   Cardiovascular: Negative.   Gastrointestinal: Negative for abdominal distention, abdominal pain, anal bleeding, blood in stool, constipation, diarrhea, nausea, rectal pain and vomiting.  Endocrine: Negative.   Genitourinary: Negative.   Musculoskeletal: Negative.  Negative for neck pain.  Skin: Negative for rash.  Neurological: Positive for headaches (last week - resolved with Ibuprofen and rest ). Negative for dizziness, tremors, seizures, syncope, facial asymmetry, speech difficulty, weakness, light-headedness and numbness.  Hematological: Negative.   Psychiatric/Behavioral: Negative.        Objective:   Physical Exam  Constitutional: She is oriented to person, place, and time. Vital signs are normal. She appears well-developed and well-nourished. She is active.  Non-toxic appearance. She does not have a sickly appearance. She does not appear ill. No distress.  Patient is alert and oriented and responsive to questions Engages in eye contact with provider. Speaks in full sentences without any pauses without any shortness of breath.     HENT:  Head: Normocephalic  and atraumatic.  Right Ear: Hearing, external ear and ear canal normal. No drainage or tenderness. No foreign bodies. Tympanic membrane is erythematous. Tympanic membrane is not perforated and not retracted. Tympanic membrane mobility is normal. A middle ear effusion is present. No decreased hearing is noted.  Left Ear: Hearing  normal. There is tenderness (tragus pull ). No drainage. No foreign bodies. Tympanic membrane is erythematous (mild ). Tympanic membrane is not perforated and not retracted. Tympanic membrane mobility is normal. A middle ear effusion is present. No decreased hearing is noted.  Nose: Nose normal.  Mouth/Throat: Oropharynx is clear and moist. No oropharyngeal exudate.  Left ear canal mildly swollen; tender tragus pull; able to visualize tympanic membrane completely.   Eyes: Pupils are equal, round, and reactive to light. Conjunctivae and EOM are normal. Right eye exhibits no discharge. Left eye exhibits no discharge. No scleral icterus.  Neck: Normal range of motion. Neck supple. No JVD present. No tracheal deviation present.  Cardiovascular: Normal rate, regular rhythm, normal heart sounds and intact distal pulses. Exam reveals no gallop and no friction rub.  No murmur heard. Pulmonary/Chest: Effort normal and breath sounds normal. No stridor. No respiratory distress. She has no wheezes. She has no rales. She exhibits no tenderness.  Abdominal: Soft. Bowel sounds are normal.  Musculoskeletal: Normal range of motion.  Patient moves on and off of exam table and in room without difficulty. Gait is normal in hall and in room. Patient is oriented to person place time and situation. Patient answers questions appropriately and engages in conversation.   Lymphadenopathy:       Head (right side): No submental, no submandibular, no tonsillar, no preauricular, no posterior auricular and no occipital adenopathy present.       Head (left side): No submental, no submandibular, no tonsillar, no preauricular, no posterior auricular and no occipital adenopathy present.    She has no cervical adenopathy.    She has no axillary adenopathy.  Neurological: She is alert and oriented to person, place, and time. She has normal strength. She displays no atrophy, no tremor and normal reflexes. No cranial nerve deficit or  sensory deficit. She exhibits normal muscle tone. She displays a negative Romberg sign. She displays no seizure activity. Coordination and gait normal. GCS eye subscore is 4. GCS verbal subscore is 5. GCS motor subscore is 6.  Skin: Skin is warm, dry and intact. No rash noted. She is not diaphoretic. No cyanosis or erythema. No pallor. Nails show no clubbing.  Psychiatric: She has a normal mood and affect. Her behavior is normal. Judgment and thought content normal.  Vitals reviewed.      Assessment:    Seasonal allergic rhinitis due to other allergic trigger  Acute suppurative otitis media of both ears without spontaneous rupture of tympanic membranes, recurrence not specified  Acute otitis externa of left ear, unspecified type      Plan:     Meds ordered this encounter  Medications  . amoxicillin (AMOXIL) 875 MG tablet    Sig: Take 1 tablet (875 mg total) by mouth 2 (two) times daily.    Dispense:  20 tablet    Refill:  0  . neomycin-polymyxin-hydrocortisone (CORTISPORIN) OTIC solution    Sig: Place 3 drops into the left ear 4 (four) times daily.    Dispense:  10 mL    Refill:  0  . predniSONE (STERAPRED UNI-PAK 21 TAB) 10 MG (21) TBPK tablet    Sig: By mouth Take 6  tablets on day 1, Take 5 tablets day 2 Take 4 tablets day 3 Take 3 tablets day 4 Take 2 tablets day five 5 Take 1 tablet day    Dispense:  21 tablet    Refill:  0  . cetirizine (ZYRTEC) 10 MG tablet    Sig: Take 1 tablet (10 mg total) by mouth daily.    Dispense:  30 tablet    Refill:  11   Patient advised to restart Zyrtec for seasonal allergies- and remain on for season. She has flexible spending card and would like a script E- prescribed.   Follow up with ENT - since you are an established patient or with your Redmon, Noelle, PA-C. Advised patient call the office or your primary care doctor for an appointment if no improvement within 72 hours or if any symptoms change or worsen at any time  Advised ER or  urgent Care if after hours or on weekend. Call 911 for emergency symptoms at any time.Patinet verbalized understanding of all instructions given/reviewed and treatment plan and has no further questions or concerns at this time.    Patient verbalized understanding of all instructions given and denies any further questions at this time.

## 2017-08-15 NOTE — Patient Instructions (Signed)
Otitis Externa Otitis externa is an infection of the outer ear canal. The outer ear canal is the area between the outside of the ear and the eardrum. Otitis externa is sometimes called "swimmer's ear." Follow these instructions at home:  If you were given antibiotic ear drops, use them as told by your doctor. Do not stop using them even if your condition gets better.  Take over-the-counter and prescription medicines only as told by your doctor.  Keep all follow-up visits as told by your doctor. This is important. How is this prevented?  Keep your ear dry. Use the corner of a towel to dry your ear after you swim or bathe.  Try not to scratch or put things in your ear. Doing these things makes it easier for germs to grow in your ear.  Avoid swimming in lakes, dirty water, or pools that may not have the right amount of a chemical called chlorine.  Consider making ear drops and putting 3 or 4 drops in each ear after you swim. Ask your doctor about how you can make ear drops. Contact a doctor if:  You have a fever.  After 3 days your ear is still red, swollen, or painful.  After 3 days you still have pus coming from your ear.  Your redness, swelling, or pain gets worse.  You have a really bad headache.  You have redness, swelling, pain, or tenderness behind your ear. This information is not intended to replace advice given to you by your health care provider. Make sure you discuss any questions you have with your health care provider. Document Released: 10/17/2007 Document Revised: 05/26/2015 Document Reviewed: 02/07/2015 Elsevier Interactive Patient Education  2018 [employer]. Otitis Media, Adult Otitis media is redness, soreness, and puffiness (swelling) in the space just behind your eardrum (middle ear). It may be caused by allergies or infection. It often happens along with a cold. Follow these instructions at home:  Take your medicine as told. Finish it even if you start to  feel better.  Only take over-the-counter or prescription medicines for pain, discomfort, or fever as told by your doctor.  Follow up with your doctor as told. Contact a doctor if:  You have otitis media only in one ear, or bleeding from your nose, or both.  You notice a lump on your neck.  You are not getting better in 3-5 days.  You feel worse instead of better. Get help right away if:  You have pain that is not helped with medicine.  You have puffiness, redness, or pain around your ear.  You get a stiff neck.  You cannot move part of your face (paralysis).  You notice that the bone behind your ear hurts when you touch it. This information is not intended to replace advice given to you by your health care provider. Make sure you discuss any questions you have with your health care provider. Document Released: 10/17/2007 Document Revised: 10/06/2015 Document Reviewed: 11/25/2012 Elsevier Interactive Patient Education  2017 [employer]. Allergies An allergy is when your body reacts to a substance in a way that is not normal. An allergic reaction can happen after you:  Eat something.  Breathe in something.  Touch something.  You can be allergic to:  Things that are only around during certain seasons, like molds and pollens.  Foods.  Drugs.  Insects.  Animal dander.  What are the signs or symptoms?  Puffiness (swelling). This may happen on the lips, face, tongue, mouth, or  throat.  Sneezing.  Coughing.  Breathing loudly (wheezing).  Stuffy nose.  Tingling in the mouth.  A rash.  Itching.  Itchy, red, puffy areas of skin (hives).  Watery eyes.  Throwing up (vomiting).  Watery poop (diarrhea).  Dizziness.  Feeling faint or fainting.  Trouble breathing or swallowing.  A tight feeling in the chest.  A fast heartbeat. How is this diagnosed? Allergies can be diagnosed with:  A medical and family history.  Skin tests.  Blood  tests.  A food diary. A food diary is a record of all the foods, drinks, and symptoms you have each day.  The results of an elimination diet. This diet involves making sure not to eat certain foods and then seeing what happens when you start eating them again.  How is this treated? There is no cure for allergies, but allergic reactions can be treated with medicine. Severe reactions usually need to be treated at a hospital. How is this prevented? The best way to prevent an allergic reaction is to avoid the thing you are allergic to. Allergy shots and medicines can also help prevent reactions in some cases. This information is not intended to replace advice given to you by your health care provider. Make sure you discuss any questions you have with your health care provider. Document Released: 08/25/2012 Document Revised: 12/26/2015 Document Reviewed: 02/09/2014 Elsevier Interactive Patient Education  [employer]2018 [employer] Inc[employer]

## 2017-10-01 DIAGNOSIS — R609 Edema, unspecified: Secondary | ICD-10-CM | POA: Diagnosis not present

## 2017-10-02 ENCOUNTER — Other Ambulatory Visit (HOSPITAL_COMMUNITY): Payer: Self-pay | Admitting: Family Medicine

## 2017-10-02 DIAGNOSIS — R609 Edema, unspecified: Secondary | ICD-10-CM

## 2017-10-03 ENCOUNTER — Other Ambulatory Visit: Payer: Self-pay

## 2017-10-03 ENCOUNTER — Ambulatory Visit (HOSPITAL_COMMUNITY): Payer: BLUE CROSS/BLUE SHIELD | Attending: Cardiology

## 2017-10-03 DIAGNOSIS — R0600 Dyspnea, unspecified: Secondary | ICD-10-CM | POA: Diagnosis not present

## 2017-10-03 DIAGNOSIS — R609 Edema, unspecified: Secondary | ICD-10-CM | POA: Diagnosis not present

## 2017-10-03 DIAGNOSIS — R002 Palpitations: Secondary | ICD-10-CM | POA: Diagnosis not present

## 2018-01-28 DIAGNOSIS — R921 Mammographic calcification found on diagnostic imaging of breast: Secondary | ICD-10-CM | POA: Diagnosis not present

## 2018-01-28 DIAGNOSIS — R922 Inconclusive mammogram: Secondary | ICD-10-CM | POA: Diagnosis not present

## 2018-02-03 ENCOUNTER — Other Ambulatory Visit: Payer: Self-pay | Admitting: Radiology

## 2018-02-03 DIAGNOSIS — Z01818 Encounter for other preprocedural examination: Secondary | ICD-10-CM | POA: Diagnosis not present

## 2018-02-03 DIAGNOSIS — N6092 Unspecified benign mammary dysplasia of left breast: Secondary | ICD-10-CM | POA: Diagnosis not present

## 2018-02-03 DIAGNOSIS — N6012 Diffuse cystic mastopathy of left breast: Secondary | ICD-10-CM | POA: Diagnosis not present

## 2018-02-12 DIAGNOSIS — M47896 Other spondylosis, lumbar region: Secondary | ICD-10-CM | POA: Diagnosis not present

## 2018-02-12 DIAGNOSIS — M545 Low back pain: Secondary | ICD-10-CM | POA: Diagnosis not present

## 2018-02-12 DIAGNOSIS — G8929 Other chronic pain: Secondary | ICD-10-CM | POA: Insufficient documentation

## 2018-03-01 DIAGNOSIS — M47816 Spondylosis without myelopathy or radiculopathy, lumbar region: Secondary | ICD-10-CM | POA: Diagnosis not present

## 2018-03-13 DIAGNOSIS — M545 Low back pain: Secondary | ICD-10-CM | POA: Diagnosis not present

## 2018-03-19 DIAGNOSIS — M545 Low back pain: Secondary | ICD-10-CM | POA: Diagnosis not present

## 2018-03-24 DIAGNOSIS — M545 Low back pain: Secondary | ICD-10-CM | POA: Diagnosis not present

## 2018-03-28 DIAGNOSIS — M545 Low back pain: Secondary | ICD-10-CM | POA: Diagnosis not present

## 2018-04-01 DIAGNOSIS — M545 Low back pain: Secondary | ICD-10-CM | POA: Diagnosis not present

## 2018-04-03 DIAGNOSIS — M545 Low back pain: Secondary | ICD-10-CM | POA: Diagnosis not present

## 2018-04-14 DIAGNOSIS — N611 Abscess of the breast and nipple: Secondary | ICD-10-CM | POA: Diagnosis not present

## 2018-04-14 DIAGNOSIS — L0291 Cutaneous abscess, unspecified: Secondary | ICD-10-CM | POA: Diagnosis not present

## 2018-04-22 DIAGNOSIS — L299 Pruritus, unspecified: Secondary | ICD-10-CM | POA: Diagnosis not present

## 2018-04-22 DIAGNOSIS — T7840XA Allergy, unspecified, initial encounter: Secondary | ICD-10-CM | POA: Diagnosis not present

## 2018-04-22 DIAGNOSIS — N611 Abscess of the breast and nipple: Secondary | ICD-10-CM | POA: Diagnosis not present

## 2018-04-24 DIAGNOSIS — N611 Abscess of the breast and nipple: Secondary | ICD-10-CM | POA: Diagnosis not present

## 2018-11-03 DIAGNOSIS — Z1211 Encounter for screening for malignant neoplasm of colon: Secondary | ICD-10-CM | POA: Diagnosis not present

## 2018-11-03 DIAGNOSIS — Z6833 Body mass index (BMI) 33.0-33.9, adult: Secondary | ICD-10-CM | POA: Diagnosis not present

## 2018-11-03 DIAGNOSIS — Z01419 Encounter for gynecological examination (general) (routine) without abnormal findings: Secondary | ICD-10-CM | POA: Diagnosis not present

## 2018-11-03 DIAGNOSIS — E569 Vitamin deficiency, unspecified: Secondary | ICD-10-CM | POA: Diagnosis not present

## 2018-11-03 DIAGNOSIS — Z0000 Encounter for general adult medical examination without abnormal findings: Secondary | ICD-10-CM | POA: Diagnosis not present

## 2019-02-03 DIAGNOSIS — N6099 Unspecified benign mammary dysplasia of unspecified breast: Secondary | ICD-10-CM | POA: Diagnosis not present

## 2019-02-24 DIAGNOSIS — Z1159 Encounter for screening for other viral diseases: Secondary | ICD-10-CM | POA: Diagnosis not present

## 2019-02-27 DIAGNOSIS — Z1211 Encounter for screening for malignant neoplasm of colon: Secondary | ICD-10-CM | POA: Diagnosis not present

## 2019-02-27 DIAGNOSIS — K635 Polyp of colon: Secondary | ICD-10-CM | POA: Diagnosis not present

## 2019-03-03 DIAGNOSIS — K635 Polyp of colon: Secondary | ICD-10-CM | POA: Diagnosis not present

## 2019-03-11 ENCOUNTER — Ambulatory Visit: Payer: Self-pay

## 2019-03-11 ENCOUNTER — Other Ambulatory Visit: Payer: Self-pay

## 2019-03-11 DIAGNOSIS — Z23 Encounter for immunization: Secondary | ICD-10-CM

## 2019-03-13 ENCOUNTER — Other Ambulatory Visit: Payer: Self-pay | Admitting: *Deleted

## 2019-03-13 DIAGNOSIS — Z20822 Contact with and (suspected) exposure to covid-19: Secondary | ICD-10-CM

## 2019-03-14 LAB — NOVEL CORONAVIRUS, NAA: SARS-CoV-2, NAA: NOT DETECTED

## 2019-05-04 ENCOUNTER — Ambulatory Visit: Payer: BC Managed Care – PPO | Attending: Internal Medicine

## 2019-05-04 DIAGNOSIS — Z20828 Contact with and (suspected) exposure to other viral communicable diseases: Secondary | ICD-10-CM | POA: Diagnosis not present

## 2019-05-04 DIAGNOSIS — Z20822 Contact with and (suspected) exposure to covid-19: Secondary | ICD-10-CM

## 2019-05-05 LAB — NOVEL CORONAVIRUS, NAA: SARS-CoV-2, NAA: NOT DETECTED

## 2019-09-18 ENCOUNTER — Other Ambulatory Visit: Payer: Self-pay

## 2019-09-18 ENCOUNTER — Ambulatory Visit: Payer: BC Managed Care – PPO | Admitting: Podiatry

## 2019-09-18 ENCOUNTER — Ambulatory Visit (INDEPENDENT_AMBULATORY_CARE_PROVIDER_SITE_OTHER): Payer: BC Managed Care – PPO

## 2019-09-18 ENCOUNTER — Encounter: Payer: Self-pay | Admitting: Podiatry

## 2019-09-18 VITALS — BP 102/61 | HR 53 | Temp 97.3°F | Resp 20

## 2019-09-18 DIAGNOSIS — M778 Other enthesopathies, not elsewhere classified: Secondary | ICD-10-CM | POA: Diagnosis not present

## 2019-09-18 DIAGNOSIS — M7672 Peroneal tendinitis, left leg: Secondary | ICD-10-CM | POA: Diagnosis not present

## 2019-09-18 DIAGNOSIS — M7671 Peroneal tendinitis, right leg: Secondary | ICD-10-CM

## 2019-09-18 MED ORDER — METHYLPREDNISOLONE 4 MG PO TBPK: ORAL_TABLET | ORAL | 0 refills | Status: DC

## 2019-09-18 MED ORDER — MELOXICAM 15 MG PO TABS: 15.0000 mg | ORAL_TABLET | Freq: Every day | ORAL | 1 refills | Status: DC

## 2019-09-23 NOTE — Progress Notes (Signed)
   HPI: 49 y.o. female presenting today with a chief complaint of burning pain to the lateral aspects of the bilateral feet that began about 6 months ago. She states the right is worse than the left and the pain is gradually worsening. Applying pressure to the feet increases the pain. She has not done anything for treatment. Patient is here for further evaluation and treatment.   Past Medical History:  Diagnosis Date  . Memory loss   . Seasonal allergies      Physical Exam: General: The patient is alert and oriented x3 in no acute distress.  Dermatology: Skin is warm, dry and supple bilateral lower extremities. Negative for open lesions or macerations.  Vascular: Palpable pedal pulses bilaterally. No edema or erythema noted. Capillary refill within normal limits.  Neurological: Epicritic and protective threshold grossly intact bilaterally.   Musculoskeletal Exam: Pain with palpation noted to the insertion of the peroneal tendons of the bilateral feet. Range of motion within normal limits to all pedal and ankle joints bilateral. Muscle strength 5/5 in all groups bilateral.   Radiographic Exam:  Normal osseous mineralization. Joint spaces preserved. No fracture/dislocation/boney destruction.    Assessment: 1. Insertional peroneal tendinitis bilateral    Plan of Care:  1. Patient evaluated. X-Rays reviewed.  2. Injection of 0.5 mLs Celestone Soluspan injected into the insertions of the peroneal tendon sheaths of the bilateral feet.  3. Prescription for Medrol Dose Pak provided to patient. 4. Prescription for Meloxicam provided to patient. 5. Recommended good shoe gear.  6. Return to clinic in 4 weeks.   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       Felecia Shelling, DPM [facility]  Dr. Felecia Shelling, DPM    [address] [place], [place] [address]                Office [phone]  Fax [phone]

## 2019-10-01 ENCOUNTER — Other Ambulatory Visit: Payer: Self-pay

## 2019-10-01 ENCOUNTER — Ambulatory Visit: Payer: BC Managed Care – PPO

## 2019-10-01 DIAGNOSIS — Z23 Encounter for immunization: Secondary | ICD-10-CM

## 2019-10-23 ENCOUNTER — Ambulatory Visit: Payer: BC Managed Care – PPO | Admitting: Podiatry

## 2019-11-11 DIAGNOSIS — Z01419 Encounter for gynecological examination (general) (routine) without abnormal findings: Secondary | ICD-10-CM | POA: Diagnosis not present

## 2019-11-11 DIAGNOSIS — Z6833 Body mass index (BMI) 33.0-33.9, adult: Secondary | ICD-10-CM | POA: Diagnosis not present

## 2019-11-11 DIAGNOSIS — N951 Menopausal and female climacteric states: Secondary | ICD-10-CM | POA: Diagnosis not present

## 2020-01-06 ENCOUNTER — Ambulatory Visit: Payer: BC Managed Care – PPO | Admitting: Dermatology

## 2020-01-06 ENCOUNTER — Other Ambulatory Visit: Payer: Self-pay

## 2020-01-06 DIAGNOSIS — L819 Disorder of pigmentation, unspecified: Secondary | ICD-10-CM

## 2020-01-06 DIAGNOSIS — R21 Rash and other nonspecific skin eruption: Secondary | ICD-10-CM | POA: Diagnosis not present

## 2020-01-06 MED ORDER — TRIAMCINOLONE ACETONIDE 0.1 % EX OINT: 1.0000 "application " | TOPICAL_OINTMENT | Freq: Two times a day (BID) | CUTANEOUS | 0 refills | Status: DC

## 2020-01-06 NOTE — Progress Notes (Addendum)
   Follow-Up Visit   Subjective  Stacy Mclaughlin is a 49 y.o. female who presents for the following: Skin Problem (Spot at right ankle. Patient noticed it August 12th, started bright red towards bottom of foot and has since spread. Now it has started turning dark. ).  Area doesn't really itch but there is a little pain. Patient did have a pedicure a few days before it broke out. Also had a cortisone shot to the ankle in May. She has not been using anything on it.   The following portions of the chart were reviewed this encounter and updated as appropriate:  Tobacco  Allergies  Meds  Problems  Med Hx  Surg Hx  Fam Hx      Review of Systems:  No other skin or systemic complaints except as noted in HPI or Assessment and Plan.  Objective  Well appearing patient in no apparent distress; mood and affect are within normal limits.  A focused examination was performed including lower legs, feet. Relevant physical exam findings are noted in the Assessment and Plan.  Objective  Right dorsal foot: Hyperpigmented patches    Objective  Right Dorsal Foot: Pink patches with overlying soft scale   Assessment & Plan  Post-inflammatory pigmentary changes Right dorsal foot  Advised this will resolve with time. Recommend daily broad spectrum sunscreen SPF 30+ to sun-exposed areas, reapply every 2 hours as needed.   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 Use as directed. Risk of skin atrophy with long-term use reviewed.   Topical steroids (such as triamcinolone, fluocinolone, fluocinonide, mometasone, clobetasol, halobetasol, betamethasone, hydrocortisone) can cause thinning and lightening of the skin if they are used for too long in the same area. Your physician has selected the right strength medicine for your problem and area affected on the body. Please use your medication  only as directed by your physician to prevent side effects.    Return if symptoms worsen or fail to improve.  Anise Salvo, RMA, am acting as scribe for Darden Dates, MD .  Documentation: I have reviewed the above documentation for accuracy and completeness, and I agree with the above.  Darden Dates, MD

## 2020-01-06 NOTE — Patient Instructions (Addendum)
Topical steroids (such as triamcinolone, fluocinolone, fluocinonide, mometasone, clobetasol, halobetasol, betamethasone, hydrocortisone) can cause thinning and lightening of the skin if they are used for too long in the same area. Your physician has selected the right strength medicine for your problem and area affected on the body. Please use your medication only as directed by your physician to prevent side effects.   Recommend daily broad spectrum sunscreen SPF 30+ to sun-exposed areas, reapply every 2 hours as needed. Call for new or changing lesions.  

## 2020-01-12 ENCOUNTER — Encounter: Payer: Self-pay | Admitting: Dermatology

## 2020-02-04 ENCOUNTER — Ambulatory Visit: Payer: BC Managed Care – PPO

## 2020-02-04 ENCOUNTER — Telehealth: Payer: BC Managed Care – PPO | Admitting: Nurse Practitioner

## 2020-02-04 ENCOUNTER — Other Ambulatory Visit: Payer: Self-pay

## 2020-02-04 DIAGNOSIS — Z20822 Contact with and (suspected) exposure to covid-19: Secondary | ICD-10-CM

## 2020-02-04 LAB — POC COVID19 BINAXNOW: SARS Coronavirus 2 Ag: NEGATIVE

## 2020-02-04 MED ORDER — AMOXICILLIN-POT CLAVULANATE 875-125 MG PO TABS: 1.0000 | ORAL_TABLET | Freq: Two times a day (BID) | ORAL | 0 refills | Status: DC

## 2020-02-04 MED ORDER — FLUTICASONE PROPIONATE 50 MCG/ACT NA SUSP: 2.0000 | Freq: Every day | NASAL | 6 refills | Status: DC

## 2020-02-04 NOTE — Progress Notes (Signed)
   Subjective:    Patient ID: Stacy Mclaughlin, female    DOB: 05-Jul-1970, 49 y.o.   MRN: [id number]  HPI   49 year old female vaccinated in May for COVID denies any sick close contacts. Complains today of cough sore throat PND drainage, worsening congestion and low grade fever with HA in past 4 days. Has pain throughout her sinuses.    Gets a sinus infection this time every year.   Drainage started last Monday (01/25/20)  has slowly progressed over the past week denies a history of asthma. She has been using Mucinex DM for cough support.   Review of Systems  Constitutional: Positive for fever.  HENT: Positive for congestion, rhinorrhea, sinus pressure and sinus pain.   Respiratory: Positive for cough.        Objective:   Physical Exam        Assessment & Plan:  Patient had a Rapid COVID test in the clinic with nurse that was negative.     Given history of illness 10+ days of sinus congestion with worsening symptoms and history of sinus infections will cover with antibiotics. Advised to RTC if symptoms persist or with new concerns  Continue OTC for support of cough and congestion

## 2020-02-26 DIAGNOSIS — R921 Mammographic calcification found on diagnostic imaging of breast: Secondary | ICD-10-CM | POA: Diagnosis not present

## 2020-04-04 ENCOUNTER — Ambulatory Visit: Payer: BC Managed Care – PPO | Admitting: Registered Nurse

## 2020-04-04 ENCOUNTER — Encounter: Payer: Self-pay | Admitting: Registered Nurse

## 2020-04-04 ENCOUNTER — Other Ambulatory Visit: Payer: Self-pay

## 2020-04-04 VITALS — BP 120/59 | HR 65 | Temp 97.3°F | Ht 68.0 in | Wt 190.6 lb

## 2020-04-04 DIAGNOSIS — S29019A Strain of muscle and tendon of unspecified wall of thorax, initial encounter: Secondary | ICD-10-CM

## 2020-04-04 DIAGNOSIS — W108XXA Fall (on) (from) other stairs and steps, initial encounter: Secondary | ICD-10-CM

## 2020-04-04 MED ORDER — CYCLOBENZAPRINE HCL 5 MG PO TABS: 5.0000 mg | ORAL_TABLET | Freq: Three times a day (TID) | ORAL | 0 refills | Status: AC | PRN

## 2020-04-04 MED ORDER — BIOFREEZE 4 % EX GEL: 1.0000 "application " | Freq: Four times a day (QID) | CUTANEOUS | 0 refills | Status: AC | PRN

## 2020-04-04 NOTE — Patient Instructions (Signed)
Thoracic Strain Rehab Ask your health care provider which exercises are safe for you. Do exercises exactly as told by your health care provider and adjust them as directed. It is normal to feel mild stretching, pulling, tightness, or discomfort as you do these exercises. Stop right away if you feel sudden pain or your pain gets worse. Do not begin these exercises until told by your health care provider. Stretching and range-of-motion exercise This exercise warms up your muscles and joints and improves the movement and flexibility of your back and shoulders. This exercise also helps to relieve pain. Chest and spine stretch  1. Lie down on your back on a firm surface. 2. Roll a towel or a small blanket so it is about 4 inches (10 cm) in diameter. 3. Put the towel lengthwise under the middle of your back so it is under your spine, but not under your shoulder blades. 4. Put your hands behind your head and let your elbows fall to your sides. This will increase your stretch. 5. Take a deep breath (inhale). 6. Hold for ____15______ seconds. 7. Relax after you breathe out (exhale). Repeat ____3______ times. Complete this exercise _____3_____ times a day. Strengthening exercises These exercises build strength and endurance in your back and your shoulder blade muscles. Endurance is the ability to use your muscles for a long time, even after they get tired. Alternating arm and leg raises  1. Get on your hands and knees on a firm surface. If you are on a hard floor, you may want to use padding, such as an exercise mat, to cushion your knees. 2. Line up your arms and legs. Your hands should be directly below your shoulders, and your knees should be directly below your hips. 3. Lift your left leg behind you. At the same time, raise your right arm and straighten it in front of you. ? Do not lift your leg higher than your hip. ? Do not lift your arm higher than your shoulder. ? Keep your abdominal and back  muscles tight. ? Keep your hips facing the ground. ? Do not arch your back. ? Keep your balance carefully, and do not hold your breath. 4. Hold for ___15_______ seconds. 5. Slowly return to the starting position and repeat with your right leg and your left arm. Repeat _____3_____ times. Complete this exercise _____3_____ times a day. Straight arm rows This exercise is also called shoulder extension exercise. 1. Stand with your feet shoulder width apart. 2. Secure an exercise band to a stable object in front of you so the band is at or above shoulder height. 3. Hold one end of the exercise band in each hand. 4. Straighten your elbows and lift your hands up to shoulder height. 5. Step back, away from the secured end of the exercise band, until the band stretches. 6. Squeeze your shoulder blades together and pull your hands down to the sides of your thighs. Stop when your hands are straight down by your sides. This is shoulder extension. Do not let your hands go behind your body. 7. Hold for ___15_______ seconds. 8. Slowly return to the starting position. Repeat ______3____ times. Complete this exercise _____3_____ times a day. Prone shoulder external rotation 1. Lie on your abdomen on a firm bed so your left / right forearm hangs over the edge of the bed and your upper arm is on the bed, straight out from your body. This is the prone position. ? Your elbow should be bent. ?  Your palm should be facing your feet. 2. If instructed, hold a ___none_______ weight in your hand. 3. Squeeze your shoulder blade toward the middle of your back. Do not let your shoulder lift toward your ear. 4. Keep your elbow bent in a 90-degree angle (right angle) while you slowly move your forearm up toward the ceiling. Move your forearm up to the height of the bed, toward your head. This is external rotation. ? Your upper arm should not move. ? At the top of the movement, your palm should face the floor. 5. Hold for  ___15_______ seconds. 6. Slowly return to the starting position and relax your muscles. Repeat ______3____ times. Complete this exercise _______3___ times a day. Rowing scapular retraction This is an exercise in which the shoulder blades (scapulae) are pulled toward each other (retraction). 1. Sit in a stable chair without armrests, or stand up. 2. Secure an exercise band to a stable object in front of you so the band is at shoulder height. 3. Hold one end of the exercise band in each hand. Your palms should face down. 4. Bring your arms out straight in front of you. 5. Step back, away from the secured end of the exercise band, until the band stretches. 6. Pull the band backward. As you do this, bend your elbows and squeeze your shoulder blades together, but avoid letting the rest of your body move. Do not shrug your shoulders upward while you do this. 7. Stop when your elbows are at your sides or slightly behind your body. 8. Hold for ___15_______ seconds. 9. Slowly straighten your arms to return to the starting position. Repeat ____3______ times. Complete this exercise _____3_____ times a day. Posture and body mechanics Good posture and healthy body mechanics can help to relieve stress in your body's tissues and joints. Body mechanics refers to the movements and positions of your body while you do your daily activities. Posture is part of body mechanics. Good posture means:  Your spine is in its natural S-curve position (neutral).  Your shoulders are pulled back slightly.  Your head is not tipped forward. Follow these guidelines to improve your posture and body mechanics in your everyday activities. Standing   When standing, keep your spine neutral and your feet about hip width apart. Keep a slight bend in your knees. Your ears, shoulders, and hips should line up with each other.  When you do a task in which you lean forward while standing in one place for a long time, place one foot  up on a stable object that is 2-4 inches (5-10 cm) high, such as a footstool. This helps keep your spine neutral. Sitting   When sitting, keep your spine neutral and keep your feet flat on the floor. Use a footrest, if necessary, and keep your thighs parallel to the floor. Avoid rounding your shoulders, and avoid tilting your head forward.  When working at a desk or a computer, keep your desk at a height where your hands are slightly lower than your elbows. Slide your chair under your desk so you are close enough to maintain good posture.  When working at a computer, place your monitor at a height where you are looking straight ahead and you do not have to tilt your head forward or downward to look at the screen. Resting When lying down and resting, avoid positions that are most painful for you.  If you have pain with activities such as sitting, bending, stooping, or squatting (flexion-basedactivities), lie  in a position in which your body does not bend very much. For example, avoid curling up on your side with your arms and knees near your chest (fetal position).  If you have pain with activities such as standing for a long time or reaching with your arms (extension-basedactivities), lie with your spine in a neutral position and bend your knees slightly. Try the following positions: ? Lie on your side with a pillow between your knees. ? Lie on your back with a pillow under your knees.  Lifting   When lifting objects, keep your feet at least shoulder width apart and tighten your abdominal muscles.  Bend your knees and hips and keep your spine neutral. It is important to lift using the strength of your legs, not your back. Do not lock your knees straight out.  Always ask for help to lift heavy or awkward objects. This information is not intended to replace advice given to you by your health care provider. Make sure you discuss any questions you have with your health care provider. Document  Revised: 08/22/2018 Document Reviewed: 06/09/2018 [employer] Patient Education  2020 [employer] Thoracic Strain A thoracic strain, which is sometimes called a mid-back strain, is an injury to the muscles or tendons that attach to the upper part of your back behind your chest. This type of injury occurs when a muscle is overstretched or overloaded. Thoracic strains can range from mild to severe. Mild strains may involve stretching a muscle or tendon without tearing it. These injuries may heal in 1-2 weeks. More severe strains involve tearing of muscle fibers or tendons. These will cause more pain and may take 6-8 weeks to heal. What are the causes? This condition may be caused by:  Trauma, such as a fall or a hit to the body.  Twisting or overstretching the back. This may result from doing activities that require a lot of energy, such as lifting heavy objects. In some cases, the cause may not be known. What increases the risk? This injury is more common in:  Athletes.  People with obesity. What are the signs or symptoms? The main symptom of this condition is pain in the middle back, especially with movement. Other symptoms include:  Stiffness or limited range of motion.  Sudden muscle tightening (spasms). How is this diagnosed? This condition may be diagnosed based on:  Your symptoms.  Your medical history.  A physical exam.  Imaging tests, such as X-rays or an MRI. How is this treated? This condition may be treated with:  Resting the injured area.  Applying heat and cold to the injured area.  Over-the-counter medicines for pain and inflammation, such as NSAIDs.  Prescription pain medicine or muscle relaxants may be needed for a short time.  Physical therapy. This will involve doing stretching and strengthening exercises. Follow these instructions at home: Managing pain, stiffness, and swelling      If directed, put ice on the injured area. ? Put ice in a plastic  bag. ? Place a towel between your skin and the bag. ? Leave the ice on for 20 minutes, 2-3 times a day.  If directed, apply heat to the affected area as often as told by your health care provider. Use the heat source that your health care provider recommends, such as a moist heat pack or a heating pad. ? Place a towel between your skin and the heat source. ? Leave the heat on for 20-30 minutes. ? Remove the heat if your  skin turns bright red. This is especially important if you are unable to feel pain, heat, or cold. You may have a greater risk of getting burned. Activity  Rest and return to your normal activities as told by your health care provider. Ask your health care provider what activities are safe for you.  Do exercises as told by your health care provider. Medicines  Take over-the-counter and prescription medicines only as told by your health care provider.  Ask your health care provider if the medicine prescribed to you: ? Requires you to avoid driving or using heavy machinery. ? Can cause constipation. You may need to take these actions to prevent or treat constipation:  Drink enough fluid to keep your urine pale yellow.  Take over-the-counter or prescription medicines.  Eat foods that are high in fiber, such as beans, whole grains, and fresh fruits and vegetables.  Limit foods that are high in fat and processed sugars, such as fried or sweet foods. Injury prevention To prevent a future mid-back injury:  Always warm up properly before physical activity or sports.  Cool down and stretch after being active.  Use correct form when playing sports and lifting heavy objects. Bend your knees before you lift heavy objects.  Use good posture when sitting and standing.  Stay physically fit and maintain a healthy weight. ? Do at least 150 minutes of moderate-intensity exercise each week, such as brisk walking or water aerobics. ? Do strength exercises at least 2 times each  week.  General instructions  Do not use any products that contain nicotine or tobacco, such as cigarettes, e-cigarettes, and chewing tobacco. If you need help quitting, ask your health care provider.  Keep all follow-up visits as told by your health care provider. This is important. Contact a health care provider if:  Your pain is not helped by medicine.  Your pain or stiffness is getting worse.  You develop pain or stiffness in your neck or lower back. Get help right away if you:  Have shortness of breath.  Have chest pain.  Develop numbness or weakness in your legs or arms.  Have involuntary loss of urine (urinary incontinence). Summary  A thoracic strain, which is sometimes called a mid-back strain, is an injury to the muscles or tendons that attach to the upper part of your back behind your chest.  This type of injury occurs when a muscle is overstretched or overloaded.  Rest and return to your normal activities as told by your health care provider. If directed, apply heat or ice to the affected area as often as told by your health care provider.  Take over-the-counter and prescription medicines only as told by your health care provider.  Contact a health care provider if you have new or worsening symptoms. This information is not intended to replace advice given to you by your health care provider. Make sure you discuss any questions you have with your health care provider. Document Revised: 03/18/2018 Document Reviewed: 03/18/2018 [employer] Patient Education  2020 [employer] Contusion A contusion is a deep bruise. Contusions are the result of a blunt injury to tissues and muscle fibers under the skin. The injury causes bleeding under the skin. The skin overlying the contusion may turn blue, purple, or yellow. Minor injuries will give you a painless contusion, but more severe injuries cause contusions that may stay painful and swollen for a few weeks. Follow these  instructions at home: Pay attention to any changes in your symptoms. Let  your health care provider know about them. Take these actions to relieve your pain. Managing pain, stiffness, and swelling   Use resting, icing, applying pressure (compression), and raising (elevating) the injured area. This is often called the RICE strategy. ? Rest the injured area. Return to your normal activities as told by your health care provider. Ask your health care provider what activities are safe for you. ? If directed, put ice on the injured area:  Put ice in a plastic bag.  Place a towel between your skin and the bag.  Leave the ice on for 20 minutes, 2-3 times per day. ? If directed, apply light compression to the injured area using an elastic bandage. Make sure the bandage is not wrapped too tightly. Remove and reapply the bandage as directed by your health care provider. ? If possible, raise (elevate) the injured area above the level of your heart while you are sitting or lying down. General instructions  Take over-the-counter and prescription medicines only as told by your health care provider.  Keep all follow-up visits as told by your health care provider. This is important. Contact a health care provider if:  Your symptoms do not improve after several days of treatment.  Your symptoms get worse.  You have difficulty moving the injured area. Get help right away if:  You have severe pain.  You have numbness in a hand or foot.  Your hand or foot turns pale or cold. Summary  A contusion is a deep bruise.  Contusions are the result of a blunt injury to tissues and muscle fibers under the skin.  It is treated with rest, ice, compression, and elevation. You may be given over-the-counter medicines for pain.  Contact a health care provider if your symptoms do not improve, or get worse.  Get help right away if you have severe pain, have numbness, or the area turns pale or cold. This  information is not intended to replace advice given to you by your health care provider. Make sure you discuss any questions you have with your health care provider. Document Revised: 12/19/2017 Document Reviewed: 12/19/2017 [employer] Patient Education  2020 [employer]

## 2020-04-04 NOTE — Progress Notes (Signed)
Subjective:    Patient ID: Stacy Mclaughlin, female    DOB: 1971/01/12, 49 y.o.   MRN: [id number]  49y/o caucasian female established patient here for evaluation after fall down garage steps this weekend (slip/tripped).  Caught self with left arm/hand.  Denied loss of bowel/bladder control, LOC, hitting head, saddle paresthesias or arm/leg weakness.  Pain in flank left motrin 600-800mg  po TID prn helping.  "Feels like area swollen to me"  Hosted family for thanksgiving dinner this weekend and at work today.  Denied tingling/numbness in extremities or weakness.  Pain worsens with lifting arm overhead, getting up from chair after sitting or lying down in bed takes some minutes to flare back down.  Hasn't tried heat/topical creams/ice.  Fully covid vaccinated dose 1 08/05/2019 and #2 08/26/2019 and booster scheduled for 04/12/2020 on [facility]  Doesn't feel like when her lower back was hurting when she was treated at [facility]--this is higher in her back near bra strap line.     Review of Systems  Constitutional: Positive for activity change. Negative for appetite change, chills, diaphoresis, fatigue and fever.  HENT: Negative for trouble swallowing and voice change.   Eyes: Negative for photophobia and visual disturbance.  Respiratory: Negative for cough, chest tightness, shortness of breath, wheezing and stridor.   Cardiovascular: Negative for palpitations.  Gastrointestinal: Negative for nausea and vomiting.  Endocrine: Negative for cold intolerance and heat intolerance.  Genitourinary: Negative for difficulty urinating and enuresis.  Musculoskeletal: Positive for back pain and myalgias. Negative for gait problem, joint swelling, neck pain and neck stiffness.  Skin: Negative for color change and rash.  Allergic/Immunologic: Positive for environmental allergies. Negative for food allergies.  Neurological: Negative for dizziness, tremors, seizures, syncope, facial asymmetry, speech difficulty,  weakness, light-headedness, numbness and headaches.  Hematological: Negative for adenopathy. Does not bruise/bleed easily.  Psychiatric/Behavioral: Negative for behavioral problems, self-injury and suicidal ideas.       Objective:   Physical Exam Vitals and nursing note reviewed.  Constitutional:      General: She is awake. She is not in acute distress.    Appearance: Normal appearance. She is well-developed, well-groomed and overweight. She is ill-appearing. She is not toxic-appearing or diaphoretic.  HENT:     Head: Normocephalic and atraumatic.     Jaw: There is normal jaw occlusion.     Right Ear: Hearing and external ear normal.     Left Ear: Hearing and external ear normal.     Nose: Nose normal. No congestion or rhinorrhea.     Mouth/Throat:     Pharynx: Oropharynx is clear.  Eyes:     General: Lids are normal. Vision grossly intact. Gaze aligned appropriately. No allergic shiner, visual field deficit or scleral icterus.       Right eye: No discharge.        Left eye: No discharge.     Extraocular Movements: Extraocular movements intact.     Conjunctiva/sclera: Conjunctivae normal.     Pupils: Pupils are equal, round, and reactive to light.  Neck:     Trachea: Trachea and phonation normal. No tracheal deviation.  Cardiovascular:     Rate and Rhythm: Normal rate and regular rhythm.     Pulses: Normal pulses.          Radial pulses are 2+ on the right side and 2+ on the left side.  Pulmonary:     Effort: Pulmonary effort is normal. No respiratory distress.     Breath sounds: Normal  breath sounds and air entry. No stridor or transmitted upper airway sounds. No wheezing, rhonchi or rales.     Comments: Wearing cloth mask due to covid 19 pandemic; spoke full sentences without difficulty; no cough observed in exam room Abdominal:     General: Abdomen is flat.  Musculoskeletal:        General: Tenderness and signs of injury present. No swelling or deformity.     Right  shoulder: Normal. No swelling, deformity, effusion or laceration.     Left shoulder: Normal. No swelling, deformity, effusion or laceration.     Right elbow: Normal. No swelling, deformity, effusion or lacerations. Normal range of motion. No tenderness.     Left elbow: Normal. No swelling, deformity, effusion or lacerations. Normal range of motion. No tenderness.     Right hand: Normal. No swelling, deformity, lacerations, tenderness or bony tenderness. Normal range of motion. Normal strength. Normal sensation. Normal capillary refill.     Left hand: Normal. No swelling, deformity, lacerations, tenderness or bony tenderness. Normal range of motion. Normal strength. Normal sensation. Normal capillary refill.       Arms:     Cervical back: Normal, normal range of motion and neck supple. No swelling, edema, deformity, erythema, signs of trauma, lacerations, rigidity, spasms, torticollis, tenderness or crepitus. No pain with movement. Normal range of motion.     Thoracic back: Tenderness present. No swelling, edema, deformity, signs of trauma, lacerations, spasms or bony tenderness. Decreased range of motion. No scoliosis.     Lumbar back: No swelling, edema, deformity, signs of trauma, lacerations, spasms, tenderness or bony tenderness. Normal range of motion. No scoliosis.       Back:     Right lower leg: No edema.     Left lower leg: No edema.     Comments: Slow to get out of chair with thoracic flexion and lateral bending/rotation also painful to patient; holding breath or gasping with position changes; on/off sitting exam table without difficulty; right patellar DTR 1+ (history ACL repair); left 2+/4 today bilateral lower extremity strength equal 5/5 and upper also 5/5; TTP paraspinals/intercostals T4-T6 greatest no bruising/ecchymosis/edema/erythema/abrasions  Lymphadenopathy:     Cervical: No cervical adenopathy.     Right cervical: No superficial cervical adenopathy.    Left cervical: No  superficial cervical adenopathy.  Skin:    General: Skin is warm and dry.     Capillary Refill: Capillary refill takes less than 2 seconds.     Coloration: Skin is not ashen, cyanotic, jaundiced, mottled, pale or sallow.     Findings: No abrasion, abscess, acne, bruising, burn, ecchymosis, erythema, signs of injury, laceration, lesion, petechiae, rash or wound.     Nails: There is no clubbing.  Neurological:     General: No focal deficit present.     Mental Status: She is alert and oriented to person, place, and time. Mental status is at baseline.     Cranial Nerves: Cranial nerves are intact. No cranial nerve deficit, dysarthria or facial asymmetry.     Sensory: Sensation is intact. No sensory deficit.     Motor: Motor function is intact. No weakness, tremor, atrophy, abnormal muscle tone or seizure activity.     Coordination: Coordination is intact. Coordination normal.     Gait: Gait is intact. Gait normal.     Deep Tendon Reflexes:     Reflex Scores:      Patellar reflexes are 1+ on the right side and 2+ on the left side.  Comments: Normal heel toe gait; bilateral hand grasp 5/5 equal;   Psychiatric:        Attention and Perception: Attention and perception normal.        Mood and Affect: Mood and affect normal.        Speech: Speech normal.        Behavior: Behavior normal. Behavior is cooperative.        Thought Content: Thought content normal.        Cognition and Memory: Cognition and memory normal.        Judgment: Judgment normal.           Assessment & Plan:  A-thoracic strain initial encounter, fall down steps initial encounter  P-cyclobenazeprine/flexeril 5-10mg  po TID prn muscle spasms #30 RF0 electronic rx to pharmacy of choice.  Ibuprofen 800mg  po TID prn pain.  Avoid alcohol intake and driving for 8 hours after taking cyclobenazeprine/flexeril as drowsiness common side effect.   Make cut in half, take 1 or 2.  Start with 1/2-1 tab.   Slow position changes as  medication also lower blood pressure.  Home stretches demonstrated to patient-e.g. Arm circles, walking up wall, chest stretches, neck AROM, chin tucks, knee to chest and rock side to side on back. Self massage or professional prn, foam roller use or tennis/racquetball. Consider biofreeze gel 4% apply QID prn pain.   May alternate Heat/cryotherapy 15 minutes QID prn.  Consider trial thermacare patches.  Consider physical therapy referral if no improvement with prescribed therapy from [facility] and/or chiropractic care.  Ensure ergonomics correct desk at work avoid repetitive motions if possible/holding phone/laptop in hand use desk/stand and/or break up lifting items into smaller loads/weights.  Patient was instructed to rest, ice, and ROM exercises.  Activity as tolerated.  Consider epsom salt soak bath daily.  Discussed prolonged static positions typically make pain worse and to move/stretch hourly.  Start with gentle arom forward bending/extension and lateral.  Consider tennis ball between her back muscles and wall to give gentle massage or use professional masseuse.   Follow up if symptoms persist or worsen especially if loss of bowel/bladder control, arm/leg weakness and/or saddle paresthesias.  Exitcare handout on thoracic strain and rehab exercises printed and given to patient.  Patient verbalized agreement and understanding of treatment plan and had no further questions at this time.  P2:  Injury Prevention and Fitness.

## 2020-06-08 ENCOUNTER — Other Ambulatory Visit: Payer: Self-pay

## 2020-06-08 ENCOUNTER — Telehealth: Payer: BC Managed Care – PPO | Admitting: Medical

## 2020-06-08 ENCOUNTER — Ambulatory Visit: Payer: BC Managed Care – PPO

## 2020-06-08 DIAGNOSIS — J029 Acute pharyngitis, unspecified: Secondary | ICD-10-CM

## 2020-06-08 DIAGNOSIS — Z20822 Contact with and (suspected) exposure to covid-19: Secondary | ICD-10-CM

## 2020-06-08 DIAGNOSIS — R059 Cough, unspecified: Secondary | ICD-10-CM

## 2020-06-08 DIAGNOSIS — R519 Headache, unspecified: Secondary | ICD-10-CM

## 2020-06-08 LAB — POC COVID19 BINAXNOW: SARS Coronavirus 2 Ag: NEGATIVE

## 2020-06-08 NOTE — Progress Notes (Signed)
   Subjective:    Patient ID: Stacy Mclaughlin, female    DOB: 06/25/1970, 50 y.o.   MRN: [id number]  HPI 50 yo female in non acute distress, consents to telemedicine appointment. Yesterday symptoms started with  HA, ST, cough , achy feeling and  Diarrhea on Sunday Coworker who tested positve last Thursday very minimum exposure per patient.(<86min).   Pfizer Vaccinated and boosted O2 99% HR62  No loss of taste or smell. Review of Systems  Constitutional: Positive for chills, fatigue and fever.  HENT: Positive for congestion, rhinorrhea and sore throat. Negative for ear pain, postnasal drip, sinus pressure, sinus pain and sneezing.   Respiratory: Positive for cough and chest tightness. Negative for shortness of breath and wheezing.   Cardiovascular: Negative.   Gastrointestinal: Positive for diarrhea. Negative for abdominal pain.  Genitourinary: Negative for difficulty urinating.  Musculoskeletal: Positive for myalgias.  Skin: Negative for color change and rash.  Allergic/Immunologic: Positive for environmental allergies.  Neurological: Positive for headaches. Negative for dizziness, syncope and light-headedness.     No loss of taste or smell Objective:   Physical Exam  AXOX3 No physical performed due to telemedicine appointment.   Results for orders placed or performed in visit on 06/08/20 (from the past 24 hour(s))  POC COVID-19     Status: Normal   Collection Time: 06/08/20  2:13 PM  Result Value Ref Range   SARS Coronavirus 2 Ag Negative Negative   Orders Placed This Encounter  Procedures  . Novel Coronavirus, NAA ([facility])    Order Specific Question:   Is this test for diagnosis or screening    Answer:   Diagnosis of ill patient    Order Specific Question:   Symptomatic for COVID-19 as defined by CDC    Answer:   Yes    Order Specific Question:   Date of Symptom Onset    Answer:   06/05/2020    Order Specific Question:   Hospitalized for COVID-19    Answer:   No     Order Specific Question:   Admitted to ICU for COVID-19    Answer:   No    Order Specific Question:   Previously tested for COVID-19    Answer:   Yes    Order Specific Question:   Resident in a congregate (group) care setting    Answer:   No    Order Specific Question:   Is the patient student?    Answer:   No    Order Specific Question:   Employed in healthcare setting    Answer:   No    Order Specific Question:   Pregnant    Answer:   No    Order Specific Question:   Has patient completed COVID vaccination(s) (2 doses of Pfizer/Moderna 1 dose of [employer])    Answer:   Yes    Assessment & Plan:  Covid-19  screening HA ,ST, cough PCR pending.  try OTC Delsym for cough per package directions. Isolate , rest , increase fluids, OTC Motrin or Tylenol for fever or if not feeling well.  If PCR is positive may return to work on 06/13/20 if all symptoms are improving and no fever x 24 hours with no Motrin or Tylenol. If not improving to contact clinic. Patient verbalizes understanding and has no questions at the end of our conversation.

## 2020-06-08 NOTE — Patient Instructions (Signed)

## 2020-06-10 LAB — NOVEL CORONAVIRUS, NAA: SARS-CoV-2, NAA: NOT DETECTED

## 2020-06-10 LAB — SARS-COV-2, NAA 2 DAY TAT

## 2020-06-10 NOTE — Progress Notes (Signed)
Called and notified patient of negative PCR lab result.

## 2020-06-15 ENCOUNTER — Ambulatory Visit: Payer: BC Managed Care – PPO

## 2020-06-15 ENCOUNTER — Telehealth: Payer: BC Managed Care – PPO | Admitting: Nurse Practitioner

## 2020-06-15 ENCOUNTER — Other Ambulatory Visit: Payer: Self-pay

## 2020-06-15 DIAGNOSIS — Z20822 Contact with and (suspected) exposure to covid-19: Secondary | ICD-10-CM

## 2020-06-15 DIAGNOSIS — R5383 Other fatigue: Secondary | ICD-10-CM

## 2020-06-15 LAB — POC COVID19 BINAXNOW: SARS Coronavirus 2 Ag: NEGATIVE

## 2020-06-15 NOTE — Progress Notes (Signed)
   Subjective:    Patient ID: Stacy Mclaughlin, female    DOB: July 01, 1970, 50 y.o.   MRN: [id number]  HPI   50 year old female presenting to [facility] with symptoms of: stomach upset with diarrhea no nausea, and fatigue denies cough or SOB.   Denies history of asthma  Symptoms started 06/05/2020, was improving over the past weekend and then on 06/13/20 she started to feel worse  Had a rapid and PCR 06/08/20 negative She took a home test that was negative over the past weekend  Her Husband is positive and grandson is positive for COVID-19   SpO2 99% Pulse 56  Phone [phone] Patient consents to telehealth appointment  Review of Systems  Constitutional: Positive for fatigue.  Gastrointestinal: Positive for diarrhea.   Past Medical History:  Diagnosis Date  . Memory loss   . Seasonal allergies        Objective:   Physical Exam  This was a telehealth appointment with patient after COVID-19 testing with nurse  Patient was in no acute distress during phone conversation with provider.   Recent Results (from the past 2160 hour(s))  POC COVID-19     Status: Normal   Collection Time: 06/08/20  2:13 PM  Result Value Ref Range   SARS Coronavirus 2 Ag Negative Negative    Comment: Vaccinated and boosted, has +CC at work, symptomatic. aware of negative POC results. Has phone appt with [facility] for further evaluation.  Novel Coronavirus, NAA ([facility])     Status: None   Collection Time: 06/08/20  2:48 PM   Specimen: Nasopharyngeal(NP) swabs in vial transport medium   Nasopharynge  Result Value Ref Range   SARS-CoV-2, NAA Not Detected Not Detected    Comment: This nucleic acid amplification test was developed and its performance characteristics determined by [employer]. Nucleic acid amplification tests include RT-PCR and TMA. This test has not been FDA cleared or approved. This test has been authorized by FDA under an Emergency Use Authorization (EUA). This test is  only authorized for the duration of time the declaration that circumstances exist justifying the authorization of the emergency use of in vitro diagnostic tests for detection of SARS-CoV-2 virus and/or diagnosis of COVID-19 infection under section 564(b)(1) of the Act, 21 U.S.C. 893YBO-1(B) (1), unless the authorization is terminated or revoked sooner. When diagnostic testing is negative, the possibility of a false negative result should be considered in the context of a patient's recent exposures and the presence of clinical signs and symptoms consistent with COVID-19. An individual without symptoms of COVID-19 and who is not shedding SARS-CoV-2 virus wo uld expect to have a negative (not detected) result in this assay.   SARS-COV-2, NAA 2 DAY TAT     Status: None   Collection Time: 06/08/20  2:48 PM   Nasopharynge  Result Value Ref Range   SARS-CoV-2, NAA 2 DAY TAT Performed        Assessment & Plan:  Discussed with patient that return to date will be determined by PCR results  If positive may return 06/20/2020  If negative and symptoms are improving may return with negative PCR  Discussed OTC management of symptoms  Advised hydration and adequate caloric intake   Return to clinic with new or worsening symptoms Seek immediate medical attention with any acutely worsening symptoms

## 2020-06-16 LAB — SARS-COV-2, NAA 2 DAY TAT

## 2020-06-16 LAB — NOVEL CORONAVIRUS, NAA: SARS-CoV-2, NAA: NOT DETECTED

## 2020-06-17 ENCOUNTER — Telehealth: Payer: Self-pay | Admitting: Nurse Practitioner

## 2020-06-17 NOTE — Telephone Encounter (Signed)
Left message to call clinic with for lab results  PCR for COVID-19 Negative   Recent Results (from the past 2160 hour(s))  POC COVID-19     Status: Normal   Collection Time: 06/08/20  2:13 PM  Result Value Ref Range   SARS Coronavirus 2 Ag Negative Negative    Comment: Vaccinated and boosted, has +CC at work, symptomatic. aware of negative POC results. Has phone appt with [facility] for further evaluation.  Novel Coronavirus, NAA ([facility])     Status: None   Collection Time: 06/08/20  2:48 PM   Specimen: Nasopharyngeal(NP) swabs in vial transport medium   Nasopharynge  Result Value Ref Range   SARS-CoV-2, NAA Not Detected Not Detected    Comment: This nucleic acid amplification test was developed and its performance characteristics determined by [employer]. Nucleic acid amplification tests include RT-PCR and TMA. This test has not been FDA cleared or approved. This test has been authorized by FDA under an Emergency Use Authorization (EUA). This test is only authorized for the duration of time the declaration that circumstances exist justifying the authorization of the emergency use of in vitro diagnostic tests for detection of SARS-CoV-2 virus and/or diagnosis of COVID-19 infection under section 564(b)(1) of the Act, 21 U.S.C. 209OBS-9(G) (1), unless the authorization is terminated or revoked sooner. When diagnostic testing is negative, the possibility of a false negative result should be considered in the context of a patient's recent exposures and the presence of clinical signs and symptoms consistent with COVID-19. An individual without symptoms of COVID-19 and who is not shedding SARS-CoV-2 virus wo uld expect to have a negative (not detected) result in this assay.   SARS-COV-2, NAA 2 DAY TAT     Status: None   Collection Time: 06/08/20  2:48 PM   Nasopharynge  Result Value Ref Range   SARS-CoV-2, NAA 2 DAY TAT Performed   Novel Coronavirus, NAA ([facility])      Status: None   Collection Time: 06/15/20  3:36 PM   Specimen: Nasopharyngeal(NP) swabs in vial transport medium   Nasopharynge  Result Value Ref Range   SARS-CoV-2, NAA Not Detected Not Detected    Comment: This nucleic acid amplification test was developed and its performance characteristics determined by [employer]. Nucleic acid amplification tests include RT-PCR and TMA. This test has not been FDA cleared or approved. This test has been authorized by FDA under an Emergency Use Authorization (EUA). This test is only authorized for the duration of time the declaration that circumstances exist justifying the authorization of the emergency use of in vitro diagnostic tests for detection of SARS-CoV-2 virus and/or diagnosis of COVID-19 infection under section 564(b)(1) of the Act, 21 U.S.C. 283MOQ-9(U) (1), unless the authorization is terminated or revoked sooner. When diagnostic testing is negative, the possibility of a false negative result should be considered in the context of a patient's recent exposures and the presence of clinical signs and symptoms consistent with COVID-19. An individual without symptoms of COVID-19 and who is not shedding SARS-CoV-2 virus wo uld expect to have a negative (not detected) result in this assay.   SARS-COV-2, NAA 2 DAY TAT     Status: None   Collection Time: 06/15/20  3:36 PM   Nasopharynge  Result Value Ref Range   SARS-CoV-2, NAA 2 DAY TAT Performed   POC COVID-19     Status: Normal   Collection Time: 06/15/20  4:22 PM  Result Value Ref Range   SARS Coronavirus 2 Ag  Negative Negative    Comment: vaccinated and boosted, symptomatic. aware of negative POC.  has appt with Lavone Neri for further eval

## 2020-10-19 ENCOUNTER — Encounter: Payer: Self-pay | Admitting: Nurse Practitioner

## 2020-10-19 ENCOUNTER — Other Ambulatory Visit: Payer: Self-pay

## 2020-10-19 ENCOUNTER — Ambulatory Visit: Payer: BC Managed Care – PPO | Admitting: Nurse Practitioner

## 2020-10-19 VITALS — BP 110/68 | HR 64 | Temp 101.0°F | Resp 16

## 2020-10-19 DIAGNOSIS — J0141 Acute recurrent pansinusitis: Secondary | ICD-10-CM

## 2020-10-19 DIAGNOSIS — R5081 Fever presenting with conditions classified elsewhere: Secondary | ICD-10-CM

## 2020-10-19 LAB — POCT INFLUENZA A/B
Influenza A, POC: NEGATIVE
Influenza B, POC: NEGATIVE

## 2020-10-19 LAB — POC COVID19 BINAXNOW: SARS Coronavirus 2 Ag: NEGATIVE

## 2020-10-19 MED ORDER — AMOXICILLIN-POT CLAVULANATE 875-125 MG PO TABS: 1.0000 | ORAL_TABLET | Freq: Two times a day (BID) | ORAL | 0 refills | Status: AC

## 2020-10-19 NOTE — Progress Notes (Signed)
Subjective:    Patient ID: Stacy Mclaughlin, female    DOB: Mar 21, 1971, 50 y.o.   MRN: [id number]  HPI  49 year old female presenting with fever 101 and body aches with a cough and headache. She has facial/sinus pressure with ear pain. Symptoms started yesterday.    She had a scope at ENT two days ago to investigate a chronic urge to clear her throat.  She was then started on reflux medication.   Started new medication yesterday prior to onset of URI symptoms.   States that she gets on average two sinus infections a year.  Has not had COVID-19 denies recent exposure.   Has been fully vaccinated for COVID with a booster.   Today's Vitals   10/19/20 1315  BP: 110/68  Pulse: 64  Resp: 16  Temp: (!) 101 F (38.3 C)  TempSrc: Tympanic  SpO2: 98%   There is no height or weight on file to calculate BMI. Review of Systems  Constitutional: Positive for chills and fever.  HENT: Positive for congestion, sinus pressure and sore throat.   Eyes: Negative.   Respiratory: Positive for cough.   Cardiovascular: Negative.   Musculoskeletal: Positive for myalgias.  Neurological: Negative.    Past Medical History:  Diagnosis Date  . Memory loss   . Seasonal allergies       Objective:   Physical Exam Constitutional:      General: She is not in acute distress.    Appearance: She is ill-appearing.  HENT:     Head: Normocephalic.     Right Ear: Tympanic membrane, ear canal and external ear normal.     Left Ear: Tympanic membrane, ear canal and external ear normal.     Nose: Congestion present.     Mouth/Throat:     Mouth: Mucous membranes are moist.     Pharynx: No oropharyngeal exudate or posterior oropharyngeal erythema.  Eyes:     Pupils: Pupils are equal, round, and reactive to light.  Cardiovascular:     Rate and Rhythm: Normal rate and regular rhythm.     Heart sounds: Normal heart sounds.  Pulmonary:     Effort: Pulmonary effort is normal.     Breath sounds: Normal  breath sounds.  Musculoskeletal:     Cervical back: Normal range of motion.  Skin:    General: Skin is warm.  Neurological:     General: No focal deficit present.  Psychiatric:        Mood and Affect: Mood normal.    Recent Results (from the past 2160 hour(s))  POCT Influenza A/B     Status: Normal   Collection Time: 10/19/20  1:55 PM  Result Value Ref Range   Influenza A, POC Negative Negative   Influenza B, POC Negative Negative  POC COVID-19     Status: Normal   Collection Time: 10/19/20  1:55 PM  Result Value Ref Range   SARS Coronavirus 2 Ag Negative Negative       Assessment & Plan:  Due to recent sinus procedure with onset of fever advised starting antibiotics.  Continue decongestant and antihistamines.   Tylenol for fever and body aches.  Will send out PCR for COVID and follow up with results.  If symptoms persist/change or worsen follow up as discussed.   Meds ordered this encounter  Medications  . amoxicillin-clavulanate (AUGMENTIN) 875-125 MG tablet    Sig: Take 1 tablet by mouth 2 (two) times daily for 7 days. Take with  food    Dispense:  14 tablet    Refill:  0

## 2020-10-20 ENCOUNTER — Ambulatory Visit: Payer: BC Managed Care – PPO | Admitting: Nurse Practitioner

## 2020-10-20 LAB — SARS-COV-2, NAA 2 DAY TAT

## 2020-10-20 LAB — NOVEL CORONAVIRUS, NAA: SARS-CoV-2, NAA: NOT DETECTED

## 2020-10-21 ENCOUNTER — Encounter: Payer: Self-pay | Admitting: Nurse Practitioner

## 2020-11-03 ENCOUNTER — Telehealth: Payer: BC Managed Care – PPO | Admitting: Medical

## 2020-11-03 ENCOUNTER — Other Ambulatory Visit: Payer: Self-pay

## 2020-11-04 NOTE — Progress Notes (Signed)
   Subjective:    Patient ID: Stacy Mclaughlin, female    DOB: 19-Apr-1971, 50 y.o.   MRN: [id number]  HPI 50 yo female consents to telemedicine appointment, Has had  cough, Headache, sinus pressure, non acute distress. Presents with  Fatigue, fever (101), needs guidance on return to work.   There were no vitals taken for this visit.   Review of Systems  Constitutional:  Positive for chills, fatigue and fever.  HENT:  Positive for congestion, sinus pressure and sinus pain. Negative for sore throat.   Respiratory:  Negative for shortness of breath.   Cardiovascular:  Negative for chest pain.  Gastrointestinal:  Negative for abdominal pain and diarrhea.  Neurological:  Positive for headaches.      Objective:   Physical Exam  No acute distress noted on phone call No physical exam performed due to telemedicine appointment.  Recent Results (from the past 2160 hour(s))  Novel Coronavirus, NAA ([facility])     Status: None   Collection Time: 10/19/20  1:43 PM   Specimen: Nasopharyngeal(NP) swabs in vial transport medium   Nasopharynge  Result Value Ref Range   SARS-CoV-2, NAA Not Detected Not Detected    Comment: This nucleic acid amplification test was developed and its performance characteristics determined by [employer]. Nucleic acid amplification tests include RT-PCR and TMA. This test has not been FDA cleared or approved. This test has been authorized by FDA under an Emergency Use Authorization (EUA). This test is only authorized for the duration of time the declaration that circumstances exist justifying the authorization of the emergency use of in vitro diagnostic tests for detection of SARS-CoV-2 virus and/or diagnosis of COVID-19 infection under section 564(b)(1) of the Act, 21 U.S.C. 619JKD-3(O) (1), unless the authorization is terminated or revoked sooner. When diagnostic testing is negative, the possibility of a false negative result should be considered in the  context of a patient's recent exposures and the presence of clinical signs and symptoms consistent with COVID-19. An individual without symptoms of COVID-19 and who is not shedding SARS-CoV-2 virus wo uld expect to have a negative (not detected) result in this assay.   SARS-COV-2, NAA 2 DAY TAT     Status: None   Collection Time: 10/19/20  1:43 PM   Nasopharynge  Result Value Ref Range   SARS-CoV-2, NAA 2 DAY TAT Performed   POCT Influenza A/B     Status: Normal   Collection Time: 10/19/20  1:55 PM  Result Value Ref Range   Influenza A, POC Negative Negative   Influenza B, POC Negative Negative  POC COVID-19     Status: Normal   Collection Time: 10/19/20  1:55 PM  Result Value Ref Range   SARS Coronavirus 2 Ag Negative Negative    Assessment & Plan:  Covid-19 infection Negative Flu and Covid -19 POCTs. PCR Pending , resulted as negative. May return to work if all symptoms are improvng and no fever x 24 hours and no OTC Motrin or Tylenol being taken.Isolate, rest , increase fluids , OTC Motrin or Tylenol per package instructions for  fever or pain. Patient verbalize understanding and has no questions at the end of our conversation.

## 2020-11-11 DIAGNOSIS — N611 Abscess of the breast and nipple: Secondary | ICD-10-CM | POA: Insufficient documentation

## 2021-01-19 DIAGNOSIS — N611 Abscess of the breast and nipple: Secondary | ICD-10-CM | POA: Diagnosis not present

## 2021-01-19 NOTE — Patient Instructions (Signed)

## 2021-02-15 ENCOUNTER — Ambulatory Visit: Payer: BC Managed Care – PPO

## 2021-02-15 ENCOUNTER — Other Ambulatory Visit: Payer: Self-pay

## 2021-02-15 DIAGNOSIS — Z23 Encounter for immunization: Secondary | ICD-10-CM

## 2021-02-25 DIAGNOSIS — Z1231 Encounter for screening mammogram for malignant neoplasm of breast: Secondary | ICD-10-CM | POA: Diagnosis not present

## 2021-05-24 ENCOUNTER — Ambulatory Visit: Payer: BC Managed Care – PPO | Admitting: Dermatology

## 2021-05-24 ENCOUNTER — Encounter: Payer: Self-pay | Admitting: Dermatology

## 2021-05-24 ENCOUNTER — Other Ambulatory Visit: Payer: Self-pay

## 2021-05-24 DIAGNOSIS — L7211 Pilar cyst: Secondary | ICD-10-CM | POA: Diagnosis not present

## 2021-05-24 NOTE — Patient Instructions (Addendum)
Melanoma ABCDEs ? ?Melanoma is the most dangerous type of skin cancer, and is the leading cause of death from skin disease.  You are more likely to develop melanoma if you: ?Have light-colored skin, light-colored eyes, or red or blond hair ?Spend a lot of time in the sun ?Tan regularly, either outdoors or in a tanning bed ?Have had blistering sunburns, especially during childhood ?Have a close family member who has had a melanoma ?Have atypical moles or large birthmarks ? ?Early detection of melanoma is key since treatment is typically straightforward and cure rates are extremely high if we catch it early.  ? ?The first sign of melanoma is often a change in a mole or a new dark spot.  The ABCDE system is a way of remembering the signs of melanoma. ? ?A for asymmetry:  The two halves do not match. ?B for border:  The edges of the growth are irregular. ?C for color:  A mixture of colors are present instead of an even brown color. ?D for diameter:  Melanomas are usually (but not always) greater than 6mm - the size of a pencil eraser. ?E for evolution:  The spot keeps changing in size, shape, and color. ? ?Please check your skin once per month between visits. You can use a small mirror in front and a large mirror behind you to keep an eye on the back side or your body.  ? ?If you see any new or changing lesions before your next follow-up, please call to schedule a visit. ? ?Please continue daily skin protection including broad spectrum sunscreen SPF 30+ to sun-exposed areas, reapplying every 2 hours as needed when you're outdoors.   ? ?If You Need Anything After Your Visit ? ?If you have any questions or concerns for your doctor, please call our main line at [phone] and press option 4 to reach your doctor's medical assistant. If no one answers, please leave a voicemail as directed and we will return your call as soon as possible. Messages left after 4 pm will be answered the following business day.  ? ?You may also  send us a message via MyChart. We typically respond to MyChart messages within 1-2 business days. ? ?For prescription refills, please ask your pharmacy to contact our office. Our fax number is [phone]. ? ?If you have an urgent issue when the clinic is closed that cannot wait until the next business day, you can page your doctor at the number below.   ? ?Please note that while we do our best to be available for urgent issues outside of office hours, we are not available 24/7.  ? ?If you have an urgent issue and are unable to reach us, you may choose to seek medical care at your doctor's office, retail clinic, urgent care center, or emergency room. ? ?If you have a medical emergency, please immediately call 911 or go to the emergency department. ? ?Pager Numbers ? ?- Dr. Kowalski: [phone] ? ?- Dr. Moye: [phone] ? ?- Dr. Stewart: [phone] ? ?In the event of inclement weather, please call our main line at [phone] for an update on the status of any delays or closures. ? ?Dermatology Medication Tips: ?Please keep the boxes that topical medications come in in order to help keep track of the instructions about where and how to use these. Pharmacies typically print the medication instructions only on the boxes and not directly on the medication tubes.  ? ?If your medication is too expensive, please contact   our office at [phone] option 4 or send us a message through MyChart.  ? ?We are unable to tell what your co-pay for medications will be in advance as this is different depending on your insurance coverage. However, we may be able to find a substitute medication at lower cost or fill out paperwork to get insurance to cover a needed medication.  ? ?If a prior authorization is required to get your medication covered by your insurance company, please allow us 1-2 business days to complete this process. ? ?Drug prices often vary depending on where the prescription is filled and some pharmacies may  offer cheaper prices. ? ?The website www.goodrx.com contains coupons for medications through different pharmacies. The prices here do not account for what the cost may be with help from insurance (it may be cheaper with your insurance), but the website can give you the price if you did not use any insurance.  ?- You can print the associated coupon and take it with your prescription to the pharmacy.  ?- You may also stop by our office during regular business hours and pick up a GoodRx coupon card.  ?- If you need your prescription sent electronically to a different pharmacy, notify our office through [facility] MyChart or by phone at [phone] option 4. ? ? ? ? ?Si Usted Necesita Algo Despu?s de Su Visita ? ?Tambi?n puede enviarnos un mensaje a trav?s de MyChart. Por lo general respondemos a los mensajes de MyChart en el transcurso de 1 a 2 d?as h?biles. ? ?Para renovar recetas, por favor pida a su farmacia que se ponga en contacto con nuestra oficina. Nuestro n?mero de fax es el [phone]. ? ?Si tiene un asunto urgente cuando la cl?nica est? cerrada y que no puede esperar hasta el siguiente d?a h?bil, puede llamar/localizar a su doctor(a) al n?mero que aparece a continuaci?n.  ? ?Por favor, tenga en cuenta que aunque hacemos todo lo posible para estar disponibles para asuntos urgentes fuera del horario de oficina, no estamos disponibles las 24 horas del d?a, los 7 d?as de la semana.  ? ?Si tiene un problema urgente y no puede comunicarse con nosotros, puede optar por buscar atenci?n m?dica  en el consultorio de su doctor(a), en una cl?nica privada, en un centro de atenci?n urgente o en una sala de emergencias. ? ?Si tiene una emergencia m?dica, por favor llame inmediatamente al 911 o vaya a la sala de emergencias. ? ?N?meros de b?per ? ?- Dr. Kowalski: [phone] ? ?- Dra. Moye: [phone] ? ?- Dra. Stewart: [phone] ? ?En caso de inclemencias del tiempo, por favor llame a nuestra l?nea principal al  [phone] para una actualizaci?n sobre el estado de cualquier retraso o cierre. ? ?Consejos para la medicaci?n en dermatolog?a: ?Por favor, guarde las cajas en las que vienen los medicamentos de uso t?pico para ayudarle a seguir las instrucciones sobre d?nde y c?mo usarlos. Las farmacias generalmente imprimen las instrucciones del medicamento s?lo en las cajas y no directamente en los tubos del medicamento.  ? ?Si su medicamento es muy caro, por favor, p?ngase en contacto con nuestra oficina llamando al [phone] y presione la opci?n 4 o env?enos un mensaje a trav?s de MyChart.  ? ?No podemos decirle cu?l ser? su copago por los medicamentos por adelantado ya que esto es diferente dependiendo de la cobertura de su seguro. Sin embargo, es posible que podamos encontrar un medicamento sustituto a menor costo o llenar un formulario para que el seguro cubra el medicamento   que se considera necesario.  ? ?Si se requiere una autorizaci?n previa para que su compa??a de seguros cubra su medicamento, por favor perm?tanos de 1 a 2 d?as h?biles para completar este proceso. ? ?Los precios de los medicamentos var?an con frecuencia dependiendo del lugar de d?nde se surte la receta y alguna farmacias pueden ofrecer precios m?s baratos. ? ?El sitio web www.goodrx.com tiene cupones para medicamentos de diferentes farmacias. Los precios aqu? no tienen en cuenta lo que podr?a costar con la ayuda del seguro (puede ser m?s barato con su seguro), pero el sitio web puede darle el precio si no utiliz? ning?n seguro.  ?- Puede imprimir el cup?n correspondiente y llevarlo con su receta a la farmacia.  ?- Tambi?n puede pasar por nuestra oficina durante el horario de atenci?n regular y recoger una tarjeta de cupones de GoodRx.  ?- Si necesita que su receta se env?e electr?nicamente a una farmacia diferente, informe a nuestra oficina a trav?s de MyChart de [facility] o por tel?fono llamando al [phone] y presione la opci?n 4. ? ?

## 2021-05-24 NOTE — Progress Notes (Signed)
° °  Follow-Up Visit   Subjective  Stacy Mclaughlin is a 51 y.o. female who presents for the following: Skin Problem (Patient here today for a spot at scalp. Patient advises it has been there for at least 1 year, hairdresser noticed it in December and recommended patient have it looked at. ).  No hx of skin cancer.   The following portions of the chart were reviewed this encounter and updated as appropriate:   Tobacco   Allergies   Meds   Problems   Med Hx   Surg Hx   Fam Hx       Review of Systems:  No other skin or systemic complaints except as noted in HPI or Assessment and Plan.  Objective  Well appearing patient in no apparent distress; mood and affect are within normal limits.  A focused examination was performed including scalp. Relevant physical exam findings are noted in the Assessment and Plan.  mid vertex scalp 0.5 cm subcutaneous papule    Assessment & Plan  Pilar cyst mid vertex scalp  Benign-appearing. Exam most consistent with a pilar cyst. Discussed that a cyst is a benign growth that can grow over time and sometimes get irritated or inflamed. Recommend observation if it is not bothersome. Discussed option of surgical excision to remove it if it is growing, symptomatic, or other changes noted. Please call for new or changing lesions so they can be evaluated.  Not bothersome for patient.   Photo c/w excoriation overlying cyst, resolved today.   Return if symptoms worsen or fail to improve.  Anise Salvo, RMA, am acting as scribe for Darden Dates, MD .  Documentation: I have reviewed the above documentation for accuracy and completeness, and I agree with the above.  Darden Dates, MD

## 2021-09-07 ENCOUNTER — Ambulatory Visit: Payer: BC Managed Care – PPO | Admitting: Nurse Practitioner

## 2021-09-07 ENCOUNTER — Encounter: Payer: Self-pay | Admitting: Nurse Practitioner

## 2021-09-07 VITALS — BP 120/72 | HR 62 | Temp 98.8°F | Resp 18

## 2021-09-07 DIAGNOSIS — J0110 Acute frontal sinusitis, unspecified: Secondary | ICD-10-CM

## 2021-09-07 DIAGNOSIS — J40 Bronchitis, not specified as acute or chronic: Secondary | ICD-10-CM

## 2021-09-07 MED ORDER — PREDNISONE 20 MG PO TABS: 20.0000 mg | ORAL_TABLET | Freq: Two times a day (BID) | ORAL | 0 refills | Status: AC

## 2021-09-07 MED ORDER — DOXYCYCLINE HYCLATE 100 MG PO TABS: 100.0000 mg | ORAL_TABLET | Freq: Two times a day (BID) | ORAL | 0 refills | Status: AC

## 2021-09-07 NOTE — Progress Notes (Signed)
? ?  Subjective:  ? ? Patient ID: Stacy Mclaughlin, female    DOB: [birth year], 51 y.o.   MRN: [id number] ? ?HPI ? ?51 year old female presenting to [facility] with complaints of nasal congestion, she believes may have started with allergies.  ? ?She now has a cough and worsening nasal congestion that has darker drainage. She is wheezing with cough, has a headache and PND. ? ?Symptoms started to worsen 4 days ago ?COVID test earlier this week was negative.  ? ?Taking zyrtec, flonase and advil as needed  ? ?    ?Today's Vitals  ? 09/07/21 1046  ?BP: 120/72  ?Pulse: 62  ?Resp: 18  ?Temp: 98.8 ?F (37.1 ?C)  ?TempSrc: Tympanic  ?SpO2: 97%  ? ?There is no height or weight on file to calculate BMI.  ? ?Review of Systems  ?Constitutional: Negative.   ?HENT:  Positive for congestion, rhinorrhea, sinus pressure, sinus pain and sore throat.   ?Eyes: Negative.   ?Respiratory:  Positive for cough.   ?Cardiovascular: Negative.   ?Gastrointestinal: Negative.   ?Genitourinary: Negative.   ?Musculoskeletal: Negative.   ?Neurological: Negative.   ? ?   ?Objective:  ? Physical Exam ?Constitutional:   ?   Appearance: She is ill-appearing.  ?HENT:  ?   Head: Normocephalic.  ?   Mouth/Throat:  ?   Mouth: Mucous membranes are moist.  ?Eyes:  ?   Extraocular Movements: Extraocular movements intact.  ?Cardiovascular:  ?   Rate and Rhythm: Normal rate and regular rhythm.  ?   Heart sounds: Normal heart sounds.  ?Pulmonary:  ?   Effort: Pulmonary effort is normal.  ?   Breath sounds: Examination of the right-upper field reveals wheezing. Examination of the left-upper field reveals wheezing. Wheezing present.  ?Musculoskeletal:     ?   General: Normal range of motion.  ?   Cervical back: Normal range of motion.  ?Skin: ?   General: Skin is warm.  ?Neurological:  ?   General: No focal deficit present.  ?   Mental Status: She is alert.  ?Psychiatric:     ?   Mood and Affect: Mood normal.  ? ? ? ? ? ?   ?Assessment & Plan:  ?1.  Bronchitis ? ?- predniSONE (DELTASONE) 20 MG tablet; Take 1 tablet (20 mg total) by mouth 2 (two) times daily with a meal for 5 days.  Dispense: 10 tablet; Refill: 0 ? ?2. Acute non-recurrent frontal sinusitis ? ?- predniSONE (DELTASONE) 20 MG tablet; Take 1 tablet (20 mg total) by mouth 2 (two) times daily with a meal for 5 days.  Dispense: 10 tablet; Refill: 0 ?- doxycycline (VIBRA-TABS) 100 MG tablet; Take 1 tablet (100 mg total) by mouth 2 (two) times daily for 7 days.  Dispense: 14 tablet; Refill: 0 ?   ? ?

## 2021-10-05 ENCOUNTER — Ambulatory Visit: Payer: BC Managed Care – PPO | Admitting: Nurse Practitioner

## 2021-10-05 ENCOUNTER — Encounter: Payer: Self-pay | Admitting: Nurse Practitioner

## 2021-10-05 VITALS — BP 118/70 | HR 85 | Temp 98.4°F | Wt 210.6 lb

## 2021-10-05 DIAGNOSIS — J40 Bronchitis, not specified as acute or chronic: Secondary | ICD-10-CM

## 2021-10-05 MED ORDER — ALBUTEROL SULFATE HFA 108 (90 BASE) MCG/ACT IN AERS: 2.0000 | INHALATION_SPRAY | Freq: Four times a day (QID) | RESPIRATORY_TRACT | 0 refills | Status: DC | PRN

## 2021-10-05 MED ORDER — BENZONATATE 100 MG PO CAPS: 100.0000 mg | ORAL_CAPSULE | Freq: Three times a day (TID) | ORAL | 0 refills | Status: DC | PRN

## 2021-10-05 NOTE — Progress Notes (Signed)
   Subjective:    Patient ID: Stacy Mclaughlin, female    DOB: 08-11-1970, 51 y.o.   MRN: [id number]  HPI 51 year old female presenting to [facility] with complaints of a cough. She was treated for a sinus infection and cough 4 weeks ago. Finished a course of doxycycline and prednisone during that time. Her cough is now dry and worsens when she coughs. Her cough improves at night.  Also improves if she is drinking water or chewing gum   She is continuing her allergy regimen Zyrtec and Flonase   Today's Vitals   10/05/21 1535  BP: 118/70  Pulse: 85  Temp: 98.4 F (36.9 C)  TempSrc: Tympanic  SpO2: 97%  Weight: 210 lb 9.6 oz (95.5 kg)   Body mass index is 32.02 kg/m.     Review of Systems  Constitutional: Negative.   HENT: Negative.    Eyes: Negative.   Respiratory:  Positive for cough.   Cardiovascular: Negative.   Gastrointestinal: Negative.   Endocrine: Negative.   Genitourinary: Negative.   Musculoskeletal: Negative.   Neurological: Negative.   Hematological: Negative.   Psychiatric/Behavioral: Negative.        Objective:   Physical Exam HENT:     Head: Normocephalic.     Right Ear: Tympanic membrane, ear canal and external ear normal.     Left Ear: Tympanic membrane, ear canal and external ear normal.     Nose: Nose normal.     Mouth/Throat:     Mouth: Mucous membranes are moist.  Eyes:     Pupils: Pupils are equal, round, and reactive to light.  Cardiovascular:     Rate and Rhythm: Normal rate and regular rhythm.     Heart sounds: Normal heart sounds.  Pulmonary:     Breath sounds: Normal breath sounds and air entry.  Abdominal:     General: Abdomen is flat.  Musculoskeletal:     Cervical back: Normal range of motion.  Skin:    General: Skin is warm.     Capillary Refill: Capillary refill takes less than 2 seconds.  Neurological:     General: No focal deficit present.     Mental Status: She is alert.  Psychiatric:        Mood and  Affect: Mood normal.          Assessment & Plan:  1. Bronchitis  - benzonatate (TESSALON) 100 MG capsule; Take 1 capsule (100 mg total) by mouth 3 (three) times daily as needed for cough.  Dispense: 45 capsule; Refill: 0 - albuterol (VENTOLIN HFA) 108 (90 Base) MCG/ACT inhaler; Inhale 2 puffs into the lungs every 6 (six) hours as needed for wheezing or shortness of breath.  Dispense: 8 g; Refill: 0    Continue allergy regimen RTC if symptoms persist or with new concerns

## 2021-11-01 DIAGNOSIS — M9904 Segmental and somatic dysfunction of sacral region: Secondary | ICD-10-CM | POA: Diagnosis not present

## 2021-11-01 DIAGNOSIS — M50322 Other cervical disc degeneration at C5-C6 level: Secondary | ICD-10-CM | POA: Diagnosis not present

## 2021-11-01 DIAGNOSIS — M9902 Segmental and somatic dysfunction of thoracic region: Secondary | ICD-10-CM | POA: Diagnosis not present

## 2021-11-01 DIAGNOSIS — M9901 Segmental and somatic dysfunction of cervical region: Secondary | ICD-10-CM | POA: Diagnosis not present

## 2021-11-02 DIAGNOSIS — M9901 Segmental and somatic dysfunction of cervical region: Secondary | ICD-10-CM | POA: Diagnosis not present

## 2021-11-02 DIAGNOSIS — M9904 Segmental and somatic dysfunction of sacral region: Secondary | ICD-10-CM | POA: Diagnosis not present

## 2021-11-02 DIAGNOSIS — M9902 Segmental and somatic dysfunction of thoracic region: Secondary | ICD-10-CM | POA: Diagnosis not present

## 2021-11-02 DIAGNOSIS — M50322 Other cervical disc degeneration at C5-C6 level: Secondary | ICD-10-CM | POA: Diagnosis not present

## 2021-11-06 DIAGNOSIS — M9902 Segmental and somatic dysfunction of thoracic region: Secondary | ICD-10-CM | POA: Diagnosis not present

## 2021-11-06 DIAGNOSIS — M9904 Segmental and somatic dysfunction of sacral region: Secondary | ICD-10-CM | POA: Diagnosis not present

## 2021-11-06 DIAGNOSIS — M9901 Segmental and somatic dysfunction of cervical region: Secondary | ICD-10-CM | POA: Diagnosis not present

## 2021-11-06 DIAGNOSIS — M50322 Other cervical disc degeneration at C5-C6 level: Secondary | ICD-10-CM | POA: Diagnosis not present

## 2021-11-15 DIAGNOSIS — Z124 Encounter for screening for malignant neoplasm of cervix: Secondary | ICD-10-CM | POA: Diagnosis not present

## 2021-11-15 DIAGNOSIS — M9901 Segmental and somatic dysfunction of cervical region: Secondary | ICD-10-CM | POA: Diagnosis not present

## 2021-11-15 DIAGNOSIS — M50322 Other cervical disc degeneration at C5-C6 level: Secondary | ICD-10-CM | POA: Diagnosis not present

## 2021-11-15 DIAGNOSIS — M9902 Segmental and somatic dysfunction of thoracic region: Secondary | ICD-10-CM | POA: Diagnosis not present

## 2021-11-15 DIAGNOSIS — R635 Abnormal weight gain: Secondary | ICD-10-CM | POA: Diagnosis not present

## 2021-11-15 DIAGNOSIS — N951 Menopausal and female climacteric states: Secondary | ICD-10-CM | POA: Diagnosis not present

## 2021-11-15 DIAGNOSIS — Z1239 Encounter for other screening for malignant neoplasm of breast: Secondary | ICD-10-CM | POA: Diagnosis not present

## 2021-11-15 DIAGNOSIS — Z01419 Encounter for gynecological examination (general) (routine) without abnormal findings: Secondary | ICD-10-CM | POA: Diagnosis not present

## 2021-11-15 DIAGNOSIS — Z1211 Encounter for screening for malignant neoplasm of colon: Secondary | ICD-10-CM | POA: Diagnosis not present

## 2021-11-15 DIAGNOSIS — M9904 Segmental and somatic dysfunction of sacral region: Secondary | ICD-10-CM | POA: Diagnosis not present

## 2021-11-15 DIAGNOSIS — E559 Vitamin D deficiency, unspecified: Secondary | ICD-10-CM | POA: Diagnosis not present

## 2021-11-15 DIAGNOSIS — Z79890 Hormone replacement therapy (postmenopausal): Secondary | ICD-10-CM | POA: Diagnosis not present

## 2021-11-21 DIAGNOSIS — M9902 Segmental and somatic dysfunction of thoracic region: Secondary | ICD-10-CM | POA: Diagnosis not present

## 2021-11-21 DIAGNOSIS — M9901 Segmental and somatic dysfunction of cervical region: Secondary | ICD-10-CM | POA: Diagnosis not present

## 2021-11-21 DIAGNOSIS — M50322 Other cervical disc degeneration at C5-C6 level: Secondary | ICD-10-CM | POA: Diagnosis not present

## 2021-11-21 DIAGNOSIS — M9904 Segmental and somatic dysfunction of sacral region: Secondary | ICD-10-CM | POA: Diagnosis not present

## 2021-11-23 DIAGNOSIS — M9904 Segmental and somatic dysfunction of sacral region: Secondary | ICD-10-CM | POA: Diagnosis not present

## 2021-11-23 DIAGNOSIS — M9902 Segmental and somatic dysfunction of thoracic region: Secondary | ICD-10-CM | POA: Diagnosis not present

## 2021-11-23 DIAGNOSIS — M50322 Other cervical disc degeneration at C5-C6 level: Secondary | ICD-10-CM | POA: Diagnosis not present

## 2021-11-23 DIAGNOSIS — M9901 Segmental and somatic dysfunction of cervical region: Secondary | ICD-10-CM | POA: Diagnosis not present

## 2021-11-27 DIAGNOSIS — M9901 Segmental and somatic dysfunction of cervical region: Secondary | ICD-10-CM | POA: Diagnosis not present

## 2021-11-27 DIAGNOSIS — M9902 Segmental and somatic dysfunction of thoracic region: Secondary | ICD-10-CM | POA: Diagnosis not present

## 2021-11-27 DIAGNOSIS — M9904 Segmental and somatic dysfunction of sacral region: Secondary | ICD-10-CM | POA: Diagnosis not present

## 2021-11-27 DIAGNOSIS — M50322 Other cervical disc degeneration at C5-C6 level: Secondary | ICD-10-CM | POA: Diagnosis not present

## 2021-11-29 DIAGNOSIS — M9901 Segmental and somatic dysfunction of cervical region: Secondary | ICD-10-CM | POA: Diagnosis not present

## 2021-11-29 DIAGNOSIS — M50322 Other cervical disc degeneration at C5-C6 level: Secondary | ICD-10-CM | POA: Diagnosis not present

## 2021-11-29 DIAGNOSIS — M9902 Segmental and somatic dysfunction of thoracic region: Secondary | ICD-10-CM | POA: Diagnosis not present

## 2021-11-29 DIAGNOSIS — M9904 Segmental and somatic dysfunction of sacral region: Secondary | ICD-10-CM | POA: Diagnosis not present

## 2021-12-06 DIAGNOSIS — M9904 Segmental and somatic dysfunction of sacral region: Secondary | ICD-10-CM | POA: Diagnosis not present

## 2021-12-06 DIAGNOSIS — M50322 Other cervical disc degeneration at C5-C6 level: Secondary | ICD-10-CM | POA: Diagnosis not present

## 2021-12-06 DIAGNOSIS — M9902 Segmental and somatic dysfunction of thoracic region: Secondary | ICD-10-CM | POA: Diagnosis not present

## 2021-12-06 DIAGNOSIS — M9901 Segmental and somatic dysfunction of cervical region: Secondary | ICD-10-CM | POA: Diagnosis not present

## 2022-02-27 DIAGNOSIS — Z1231 Encounter for screening mammogram for malignant neoplasm of breast: Secondary | ICD-10-CM | POA: Diagnosis not present

## 2022-03-26 ENCOUNTER — Ambulatory Visit (INDEPENDENT_AMBULATORY_CARE_PROVIDER_SITE_OTHER): Payer: Self-pay | Admitting: Physician Assistant

## 2022-03-26 VITALS — BP 120/80 | HR 56 | Temp 97.9°F | Ht 68.0 in | Wt 219.6 lb

## 2022-03-26 DIAGNOSIS — R202 Paresthesia of skin: Secondary | ICD-10-CM

## 2022-03-26 NOTE — Progress Notes (Unsigned)
[facility] [address], [place] [address]   Office Visit Note  Patient Name: Stacy Mclaughlin Date of Birth [id number]  Medical Record number [id number]  Date of Service: 03/27/2022  Chief Complaint  Patient presents with   Arm Pain    Left arm numbness that starts in neck and goes down to fingers. Heart rate has been higher and shortness of breathe 6-8 mons. Pain started today 830. Hand does tend to tingle. No hx of high BP.      51 y/o F presents to the clinic for c/o left arm numbness and tingling sensation which started earlier today. Though patient states she has intermittent pain with elevated HR for the past 6-8 months. She denies personal or family h/o stroke. Pt does admit recently that she found out from the birth mother that her mother has A-fib. Patient's daughter has SVT. When patient was young she had been worked up by Film/video editor, but not recently. She denies dizziness, nausea, vomiting, diaphoresis, CP, SOB. Denies slurring of speech. Feels more SOB over the past few months, but not sure if due to weight gain of 30 lbs. She had a complete physical by her GYN including blood work that was all within normal limits.   Arm Pain  Associated symptoms include numbness.      Current Medication:  Outpatient Encounter Medications as of 03/26/2022  Medication Sig   cetirizine (ZYRTEC) 10 MG tablet Take 10 mg by mouth daily.   ESTROVERA 4 MG TABS TAKE 1 TABLET ONCE DAILY.   albuterol (VENTOLIN HFA) 108 (90 Base) MCG/ACT inhaler Inhale 2 puffs into the lungs every 6 (six) hours as needed for wheezing or shortness of breath. (Patient not taking: Reported on 03/26/2022)   benzonatate (TESSALON) 100 MG capsule Take 1 capsule (100 mg total) by mouth 3 (three) times daily as needed for cough. (Patient not taking: Reported on 03/26/2022)   ibuprofen (ADVIL) 200 MG tablet Take 200-800 mg by mouth every 8 (eight) hours as needed for mild pain or moderate pain. (Patient  not taking: Reported on 03/26/2022)   No facility-administered encounter medications on file as of 03/26/2022.      Medical History: Past Medical History:  Diagnosis Date   Memory loss    Seasonal allergies      Vital Signs: BP 120/80 (BP Location: Left Arm, Patient Position: Sitting, Cuff Size: Normal)   Pulse (!) 56   Temp 97.9 F (36.6 C) (Tympanic)   Ht 5\' 8"  (1.727 m)   Wt 219 lb 9.6 oz (99.6 kg)   SpO2 99%   BMI 33.39 kg/m    Review of Systems  Constitutional: Negative.   HENT: Negative.    Respiratory:  Positive for chest tightness. Negative for shortness of breath.   Cardiovascular: Negative.   Gastrointestinal: Negative.   Endocrine: Negative.   Genitourinary: Negative.   Neurological:  Positive for numbness. Negative for dizziness, tremors, seizures, syncope, facial asymmetry, speech difficulty, weakness, light-headedness and headaches.  Psychiatric/Behavioral: Negative.      Physical Exam Constitutional:      Appearance: Normal appearance.  HENT:     Head: Normocephalic and atraumatic.     Right Ear: Tympanic membrane, ear canal and external ear normal.     Left Ear: Tympanic membrane, ear canal and external ear normal.     Nose: Nose normal.     Mouth/Throat:     Mouth: Mucous membranes are moist.     Pharynx: Oropharynx is clear.  Eyes:     Extraocular Movements: Extraocular movements intact.     Pupils: Pupils are equal, round, and reactive to light.  Cardiovascular:     Rate and Rhythm: Normal rate and regular rhythm.     Pulses: Normal pulses.  Pulmonary:     Effort: Pulmonary effort is normal.     Breath sounds: Normal breath sounds.  Skin:    General: Skin is warm and dry.  Neurological:     Mental Status: She is alert and oriented to person, place, and time.     Sensory: No sensory deficit.     Motor: No weakness.     Coordination: Coordination normal.     Gait: Gait normal.  Psychiatric:        Mood and Affect: Mood normal.         Behavior: Behavior normal.       Assessment/Plan:  1. Arm paresthesia, left  Reviewed my clinical findings with patient.  Follow up with PCP for a referral to Cardiologist for further evaluation. If symptoms worsen then go to ER immediately or call 911. PT verbalized understanding and in agreement. RTC if any difficulty arises.    General Counseling: niyati heinke understanding of the findings of todays visit and agrees with plan of treatment. I have discussed any further diagnostic evaluation that may be needed or ordered today. We also reviewed her medications today. she has been encouraged to call the office with any questions or concerns that should arise related to todays visit.    Time spent:25 Minutes    Gilberto Better, [place] Physician Assistant

## 2022-11-19 DIAGNOSIS — Z139 Encounter for screening, unspecified: Secondary | ICD-10-CM | POA: Diagnosis not present

## 2022-11-19 DIAGNOSIS — Z01419 Encounter for gynecological examination (general) (routine) without abnormal findings: Secondary | ICD-10-CM | POA: Diagnosis not present

## 2022-12-06 ENCOUNTER — Encounter: Payer: Self-pay | Admitting: Adult Health

## 2022-12-06 ENCOUNTER — Ambulatory Visit (INDEPENDENT_AMBULATORY_CARE_PROVIDER_SITE_OTHER): Payer: Self-pay | Admitting: Adult Health

## 2022-12-06 VITALS — BP 125/74 | HR 45 | Temp 96.9°F | Resp 16 | Wt 203.8 lb

## 2022-12-06 DIAGNOSIS — R058 Other specified cough: Secondary | ICD-10-CM

## 2022-12-06 NOTE — Progress Notes (Signed)
[facility] [address] [place], [place] [address]   Office Visit Note  Patient Name: Stacy Mclaughlin Date of Birth [id number]  Medical Record number [id number]  Date of Service: 12/06/2022  Chief Complaint  Patient presents with   URI    Diagnosed with Covid 8 days ago. Still feeling bad.     URI  Associated symptoms include coughing. Pertinent negatives include no chest pain, congestion, sinus pain or sore throat.   Pt is here for a sick visit. Patient reports she started feeling bad 10 days ago.  8 days ago she tested positive for covid.  She describes fever, chills, body aches, fatigue, cough, headache and sinus drainage.  She has tried Mucinex DM,  and sudafed with some improvement. She has albuterol inhaler, which she used yesterday, with some improvement as well.    Current Medication:  Outpatient Encounter Medications as of 12/06/2022  Medication Sig   albuterol (VENTOLIN HFA) 108 (90 Base) MCG/ACT inhaler Inhale 2 puffs into the lungs every 6 (six) hours as needed for wheezing or shortness of breath.   benzonatate (TESSALON) 100 MG capsule Take 1 capsule (100 mg total) by mouth 3 (three) times daily as needed for cough.   cetirizine (ZYRTEC) 10 MG tablet Take 10 mg by mouth daily.   ESTROVERA 4 MG TABS TAKE 1 TABLET ONCE DAILY.   ibuprofen (ADVIL) 200 MG tablet Take 200-800 mg by mouth every 8 (eight) hours as needed for mild pain or moderate pain.   No facility-administered encounter medications on file as of 12/06/2022.      Medical History: Past Medical History:  Diagnosis Date   Memory loss    Seasonal allergies      Vital Signs: BP 125/74   Pulse (!) 45   Temp (!) 96.9 F (36.1 C) (Tympanic)   Resp 16   Wt 203 lb 12.8 oz (92.4 kg)   SpO2 98%   BMI 30.99 kg/m    Review of Systems  Constitutional:  Negative for chills, fatigue and fever.  HENT:  Negative for congestion, sinus pain and sore throat.   Eyes:  Negative for pain and itching.   Respiratory:  Positive for cough.   Cardiovascular:  Negative for chest pain.    Physical Exam Vitals and nursing note reviewed.  Constitutional:      General: She is not in acute distress.    Appearance: She is normal weight. She is not ill-appearing.  HENT:     Head: Normocephalic.     Right Ear: Tympanic membrane normal.     Left Ear: Tympanic membrane normal.     Nose: Nose normal.     Mouth/Throat:     Mouth: Mucous membranes are dry.  Eyes:     General:        Right eye: No discharge.        Left eye: No discharge.     Pupils: Pupils are equal, round, and reactive to light.  Neck:     Vascular: No carotid bruit.  Cardiovascular:     Rate and Rhythm: Normal rate and regular rhythm.     Pulses: Normal pulses.     Heart sounds: No murmur heard.    No friction rub.  Pulmonary:     Effort: Pulmonary effort is normal. No respiratory distress.     Breath sounds: Normal breath sounds. No wheezing.  Musculoskeletal:     Cervical back: Normal range of motion. No rigidity.  Lymphadenopathy:  Cervical: No cervical adenopathy.  Neurological:     General: No focal deficit present.     Mental Status: She is alert and oriented to person, place, and time.  Psychiatric:        Mood and Affect: Mood normal.        Behavior: Behavior normal.    Assessment/Plan: 1. Post-viral cough syndrome Use Albuterol inhaler 2 puffs three times a day as discussed Rest, and drink plenty of water.  Use cough drops, gargle warm sal water or drink wam liquids (like tea with honey) as needed for cough/throat irritation.   Take over-the-counter medicines (such as Dayquil or Nyquil) as discussed at your visit to help manage your symptoms.  Send a MyChart message to the provider or schedule a return appointment as needed for new/worsening symptoms (especially shortness of breath or chest pain) or if symptoms not improving with recommended treatment over the next 5-7 days.         General  Counseling: Stacy Mclaughlin understanding of the findings of todays visit and agrees with plan of treatment. I have discussed any further diagnostic evaluation that may be needed or ordered today. We also reviewed her medications today. she has been encouraged to call the office with any questions or concerns that should arise related to todays visit.   No orders of the defined types were placed in this encounter.   No orders of the defined types were placed in this encounter.   Time spent:20 Minutes    Stacy Mclaughlin AGNP-C Nurse Practitioner

## 2022-12-11 ENCOUNTER — Encounter: Payer: Self-pay | Admitting: Adult Health

## 2022-12-20 DIAGNOSIS — Z131 Encounter for screening for diabetes mellitus: Secondary | ICD-10-CM | POA: Diagnosis not present

## 2022-12-20 DIAGNOSIS — Z0000 Encounter for general adult medical examination without abnormal findings: Secondary | ICD-10-CM | POA: Diagnosis not present

## 2022-12-20 DIAGNOSIS — Z13220 Encounter for screening for lipoid disorders: Secondary | ICD-10-CM | POA: Diagnosis not present

## 2022-12-20 DIAGNOSIS — I499 Cardiac arrhythmia, unspecified: Secondary | ICD-10-CM | POA: Diagnosis not present

## 2023-01-10 DIAGNOSIS — I499 Cardiac arrhythmia, unspecified: Secondary | ICD-10-CM | POA: Diagnosis not present

## 2023-01-21 DIAGNOSIS — R002 Palpitations: Secondary | ICD-10-CM | POA: Diagnosis not present

## 2023-01-21 DIAGNOSIS — I499 Cardiac arrhythmia, unspecified: Secondary | ICD-10-CM | POA: Diagnosis not present

## 2023-03-05 DIAGNOSIS — Z1231 Encounter for screening mammogram for malignant neoplasm of breast: Secondary | ICD-10-CM | POA: Diagnosis not present

## 2023-04-16 ENCOUNTER — Telehealth: Payer: Self-pay

## 2023-04-16 NOTE — Telephone Encounter (Signed)
In review of chart I see the referral is from referral from [facility] [place] but do no see cardiac records.  Call placed to referring office requesting records to be faxed.

## 2023-04-16 NOTE — Telephone Encounter (Signed)
Patient is scheduled for new patient visit on 04/18/23.  Left message for patient to call for cardiac history also additional information on referral as I do not see records associated with referral

## 2023-04-17 NOTE — Telephone Encounter (Signed)
Records scanned in media.

## 2023-04-18 ENCOUNTER — Ambulatory Visit: Payer: BC Managed Care – PPO

## 2023-04-18 ENCOUNTER — Ambulatory Visit: Payer: BC Managed Care – PPO | Attending: Cardiology | Admitting: Cardiology

## 2023-04-18 ENCOUNTER — Encounter: Payer: Self-pay | Admitting: Cardiology

## 2023-04-18 VITALS — BP 132/86 | HR 55 | Ht 68.5 in | Wt 208.4 lb

## 2023-04-18 DIAGNOSIS — R0683 Snoring: Secondary | ICD-10-CM

## 2023-04-18 DIAGNOSIS — I480 Paroxysmal atrial fibrillation: Secondary | ICD-10-CM

## 2023-04-18 MED ORDER — ASPIRIN 81 MG PO TBEC: 81.0000 mg | DELAYED_RELEASE_TABLET | Freq: Every day | ORAL | 3 refills | Status: DC

## 2023-04-18 NOTE — Patient Instructions (Addendum)
Medication Instructions:   Your physician has recommended you make the following change in your medication:   START- Aspirin 81 mg 1 tablet by mouth daily.  *If you need a refill on your cardiac medications before your next appointment, please call your pharmacy*   Lab Work:  NONE  If you have labs (blood work) drawn today and your tests are completely normal, you will receive your results only by: MyChart Message (if you have MyChart) OR A paper copy in the mail If you have any lab test that is abnormal or we need to change your treatment, we will call you to review the results.   Testing/Procedures:  Your physician has recommended that you wear a Zio monitor for 14 days.  This monitor is a medical device that records the heart's electrical activity. Doctors most often use these monitors to diagnose arrhythmias. Arrhythmias are problems with the speed or rhythm of the heartbeat. The monitor is a small device applied to your chest. You can wear one while you do your normal daily activities. While wearing this monitor if you have any symptoms to push the button and record what you felt. Once you have worn this monitor for the period of time provider prescribed (Usually 14 days), you will return the monitor device in the postage paid box. Once it is returned they will download the data collected and provide [place] with a report which the provider will then review and we will call you with those results. Important tips:  Avoid showering during the first 24 hours of wearing the monitor. Avoid excessive sweating to help maximize wear time. Do not submerge the device, no hot tubs, and no swimming pools. Keep any lotions or oils away from the patch. After 24 hours you may shower with the patch on. Take brief showers with your back facing the shower head.  Do not remove patch once it has been placed because that will interrupt data and decrease adhesive wear time. Push the button when you have any  symptoms and write down what you were feeling. Once you have completed wearing your monitor, remove and place into box which has postage paid and place in your outgoing mailbox.  If for some reason you have misplaced your box then call our office and we can provide another box and/or mail it off for you.   Your physician has requested that you have an echocardiogram. Echocardiography is a painless test that uses sound waves to create images of your heart. It provides your doctor with information about the size and shape of your heart and how well your heart's chambers and valves are working. This procedure takes approximately one hour. There are no restrictions for this procedure. Please do NOT wear cologne, perfume, aftershave, or lotions (deodorant is allowed). Please arrive 15 minutes prior to your appointment time.  Please note: We ask at that you not bring children with you during ultrasound (echo/ vascular) testing. Due to room size and safety concerns, children are not allowed in the ultrasound rooms during exams. Our front office staff cannot provide observation of children in our lobby area while testing is being conducted. An adult accompanying a patient to their appointment will only be allowed in the ultrasound room at the discretion of the ultrasound technician under special circumstances. We apologize for any inconvenience.'    Follow-Up: At [facility], you and your health needs are our priority.  As part of our continuing mission to provide you with exceptional heart  care, we have created designated Provider Care Teams.  These Care Teams include your primary Cardiologist (physician) and Advanced Practice Providers (APPs -  Physician Assistants and Nurse Practitioners) who all work together to provide you with the care you need, when you need it.  We recommend signing up for the patient portal called "MyChart".  Sign up information is provided on this After Visit Summary.   MyChart is used to connect with patients for Virtual Visits (Telemedicine).  Patients are able to view lab/test results, encounter notes, upcoming appointments, etc.  Non-urgent messages can be sent to your provider as well.   To learn more about what you can do with MyChart, go to [email or web address].    Your next appointment:   2-3 month(s)  Provider:   You may see Dr. Azucena Cecil, MD or one of the following Advanced Practice Providers on your designated Care Team:   Nicolasa Ducking, NP Eula Listen, PA-C Cadence Fransico Michael, PA-C Charlsie Quest, NP Carlos Levering, NP   Other Instructions  You have been referred to EP- Dr. Lalla Brothers, MD for Afib.  You have been referred to Pulmonary for sleep study.
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
## 2023-04-22 DIAGNOSIS — I480 Paroxysmal atrial fibrillation: Secondary | ICD-10-CM | POA: Diagnosis not present

## 2023-05-13 ENCOUNTER — Ambulatory Visit: Payer: BC Managed Care – PPO | Attending: Cardiology

## 2023-05-13 DIAGNOSIS — I480 Paroxysmal atrial fibrillation: Secondary | ICD-10-CM

## 2023-05-13 LAB — ECHOCARDIOGRAM COMPLETE
Area-P 1/2: 3.6 cm2
S' Lateral: 2.8 cm

## 2023-05-20 DIAGNOSIS — I480 Paroxysmal atrial fibrillation: Secondary | ICD-10-CM | POA: Diagnosis not present

## 2023-06-18 NOTE — Progress Notes (Signed)
 Name: Stacy Mclaughlin MRN: [id number] DOB: 1971-04-19    CHIEF COMPLAINT:  EXCESSIVE DAYTIME SLEEPINESS Assessment for OSA   HISTORY OF PRESENT ILLNESS: Patient is seen today for problems and issues with sleep related to excessive daytime sleepiness Patient  has been having sleep problems for many years Patient has been having excessive daytime sleepiness for a long time Patient has been having extreme fatigue and tiredness, lack of energy +  very Loud snoring every night + struggling breathe at night and gasps for air + morning headaches + Nonrefreshing sleep  Discussed sleep data and reviewed with patient.  Encouraged proper weight management.  Discussed driving precautions and its relationship with hypersomnolence.  Discussed operating dangerous equipment and its relationship with hypersomnolence.  Discussed sleep hygiene, and benefits of a fixed sleep waked time.  The importance of getting eight or more hours of sleep discussed with patient.  Discussed limiting the use of the computer and television before bedtime.  Decrease naps during the day, so night time sleep will become enhanced.  Limit caffeine, and sleep deprivation.  HTN, stroke, and heart failure are potential risk factors.    EPWORTH SLEEP SCORE 9   No evidence of heart failure at this time No evidence or signs of infection at this time No respiratory distress No fevers, chills, nausea, vomiting, diarrhea No evidence of lower extremity edema No evidence hemoptysis  Current weight 213 pounds Next goal weight is 208 pounds Patient with a previous history of COVID diagnosis  Patient was referred to us  with a history of paroxysmal atrial fibrillation Non-smoker Nonalcoholic Family history of OSA Family history of A-fib   PAST MEDICAL HISTORY :   has a past medical history of Heart murmur, Memory loss, and Seasonal allergies.  has a past surgical history that includes Tonsillectomy; Arthroscopic  repair ACL; laparoscopy; Knee surgery; and ARM SURGERY. Prior to Admission medications   Medication Sig Start Date End Date Taking? Authorizing Provider  albuterol  (VENTOLIN  HFA) 108 (90 Base) MCG/ACT inhaler Inhale 2 puffs into the lungs every 6 (six) hours as needed for wheezing or shortness of breath. Patient not taking: Reported on 04/18/2023 10/05/21   Kennyth Domino, FNP  aspirin  EC 81 MG tablet Take 1 tablet (81 mg total) by mouth daily. Swallow whole. 04/18/23   Darliss Rogue, MD  benzonatate  (TESSALON ) 100 MG capsule Take 1 capsule (100 mg total) by mouth 3 (three) times daily as needed for cough. Patient not taking: Reported on 04/18/2023 10/05/21   Kennyth Domino, FNP  cetirizine  (ZYRTEC ) 10 MG tablet Take 10 mg by mouth daily. Patient not taking: Reported on 04/18/2023    [provider]  ESTROVERA 4 MG TABS TAKE 1 TABLET ONCE DAILY. 10/15/20   [provider]  ibuprofen  (ADVIL ) 200 MG tablet Take 200-800 mg by mouth every 8 (eight) hours as needed for mild pain or moderate pain. Patient not taking: Reported on 04/18/2023    [provider]  progesterone (PROMETRIUM) 200 MG capsule Take 200 mg by mouth daily.    [provider]  Zinc 50 MG TABS  11/11/21   [provider]   Allergies  Allergen Reactions   Sulfamethoxazole-Trimethoprim Rash    FAMILY HISTORY:  family history is not on file. She was adopted. SOCIAL HISTORY:  reports that she has never smoked. She has never used smokeless tobacco. She reports current alcohol  use. She reports that she does not use drugs.   Review of Systems:  Gen:  Denies  fever, sweats, chills weight loss  HEENT: Denies blurred vision, double vision, ear pain, eye pain, hearing loss, nose bleeds, sore throat Cardiac:  No dizziness, chest pain or heaviness, chest tightness,edema, No JVD Resp:   No cough, -sputum production, -shortness of breath,-wheezing, -hemoptysis,  Gi: Denies swallowing difficulty,  stomach pain, nausea or vomiting, diarrhea, constipation, bowel incontinence Gu:  Denies bladder incontinence, burning urine Ext:   Denies Joint pain, stiffness or swelling Skin: Denies  skin rash, easy bruising or bleeding or hives Endoc:  Denies polyuria, polydipsia , polyphagia or weight change Psych:   Denies depression, insomnia or hallucinations  Other:  All other systems negative   ALL OTHER ROS ARE NEGATIVE   BP 122/82 (BP Location: Left Arm, Patient Position: Sitting, Cuff Size: Normal)   Pulse (!) 54   Temp 97.6 F (36.4 C) (Temporal)   Ht 5' 8 (1.727 m)   Wt 213 lb 12.8 oz (97 kg)   SpO2 99%   BMI 32.51 kg/m     Physical Examination:   General Appearance: No distress  EYES PERRLA, EOM intact.   NECK Supple, No JVD ORAL CAVITY MALLAMPATI 4 Pulmonary: normal breath sounds, No wheezing.  CardiovascularNormal S1,S2.  No m/r/g.   Abdomen: Benign, Soft, non-tender. Skin:   warm, no rashes, no ecchymosis  Extremities: normal, no cyanosis, clubbing. Neuro:without focal findings,  speech normal  PSYCHIATRIC: Mood, affect within normal limits.   ALL OTHER ROS ARE NEGATIVE    ASSESSMENT AND PLAN SYNOPSIS  53 year old pleasant white female patient with signs and symptoms of excessive daytime sleepiness with probable underlying diagnosis of obstructive sleep apnea in the setting of obesity and deconditioned state   Recommend Sleep Study for definitve diagnosis Recommend home sleep study Be aware of reduced alertness and do not drive or operate heavy machinery if experiencing this or drowsiness.  Exercise encouraged, as tolerated. Encouraged proper weight management.  Important to get eight or more hours of sleep  Limiting the use of the computer and television before bedtime.  Decrease naps during the day, so night time sleep will become enhanced.  Limit caffeine, and sleep deprivation.  HTN, stroke, uncontrolled diabetes and heart failure are potential risk  factors.  Risk of untreated sleep apnea including cardiac arrhthymias, stroke, DM, pulm HTN.     Obesity -recommend significant weight loss -recommend changing diet  Deconditioned state -Recommend increased daily activity and exercise   Atrial Fibrillation - Sleep apnea can contribute to Atrial Fibrillation, therefore treatment of sleep apnea is important part of A fib management.  Paroxysmal A-fib Continue aspirin  as prescribed Follow-up with cardiology as recommended    MEDICATION ADJUSTMENTS/LABS AND TESTS ORDERED: Recommend Sleep Study Recommend weight loss Avoid Allergens and Irritants Avoid secondhand smoke Avoid SICK contacts Recommend  Masking  when appropriate Recommend Keep up-to-date with vaccinations   CURRENT MEDICATIONS REVIEWED AT LENGTH WITH PATIENT TODAY   Patient  satisfied with Plan of action and management. All questions answered  Follow up  4 weeks    I spent a total of  61 minutes reviewing chart data, face-to-face evaluation with the patient, counseling and coordination of care as detailed above.    Nickolas Alm Cellar, M.D.  [facility]  Medical Director [facility] Medical Director [facility] Cardio-Pulmonary Department

## 2023-06-19 ENCOUNTER — Encounter: Payer: Self-pay | Admitting: Internal Medicine

## 2023-06-19 ENCOUNTER — Ambulatory Visit (INDEPENDENT_AMBULATORY_CARE_PROVIDER_SITE_OTHER): Payer: BC Managed Care – PPO | Admitting: Internal Medicine

## 2023-06-19 ENCOUNTER — Ambulatory Visit: Payer: BC Managed Care – PPO | Attending: Cardiology | Admitting: Cardiology

## 2023-06-19 ENCOUNTER — Encounter: Payer: Self-pay | Admitting: Cardiology

## 2023-06-19 VITALS — BP 122/82 | HR 54 | Temp 97.6°F | Ht 68.0 in | Wt 213.8 lb

## 2023-06-19 VITALS — BP 132/80 | HR 53 | Ht 68.0 in | Wt 214.6 lb

## 2023-06-19 DIAGNOSIS — I480 Paroxysmal atrial fibrillation: Secondary | ICD-10-CM | POA: Diagnosis not present

## 2023-06-19 DIAGNOSIS — R0683 Snoring: Secondary | ICD-10-CM

## 2023-06-19 DIAGNOSIS — G4710 Hypersomnia, unspecified: Secondary | ICD-10-CM

## 2023-06-19 DIAGNOSIS — G4733 Obstructive sleep apnea (adult) (pediatric): Secondary | ICD-10-CM

## 2023-06-19 NOTE — Patient Instructions (Signed)
 Medication Instructions:  - Your physician recommends that you continue on your current medications as directed. Please refer to the Current Medication list given to you today.  *If you need a refill on your cardiac medications before your next appointment, please call your pharmacy*   Lab Work: - none ordered  If you have labs (blood work) drawn today and your tests are completely normal, you will receive your results only by: MyChart Message (if you have MyChart) OR A paper copy in the mail If you have any lab test that is abnormal or we need to change your treatment, we will call you to review the results.   Testing/Procedures: - none ordered   Follow-Up: At [facility], you and your health needs are our priority.  As part of our continuing mission to provide you with exceptional heart care, we have created designated Provider Care Teams.  These Care Teams include your primary Cardiologist (physician) and Advanced Practice Providers (APPs -  Physician Assistants and Nurse Practitioners) who all work together to provide you with the care you need, when you need it.  We recommend signing up for the patient portal called MyChart.  Sign up information is provided on this After Visit Summary.  MyChart is used to connect with patients for Virtual Visits (Telemedicine).  Patients are able to view lab/test results, encounter notes, upcoming appointments, etc.  Non-urgent messages can be sent to your provider as well.   To learn more about what you can do with MyChart, go to [email or web address].    Your next appointment:   6 month(s)  Provider:   Redell Cave, MD    Other Instructions N/a
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
## 2023-06-19 NOTE — Patient Instructions (Signed)
 Recommend home sleep study to assess for sleep apnea  Recommend weight loss  Avoid Allergens and Irritants Avoid secondhand smoke Avoid SICK contacts Recommend  Masking  when appropriate Recommend Keep up-to-date with vaccinations   Be aware of reduced alertness and do not drive or operate heavy machinery if experiencing this or drowsiness.  Exercise encouraged, as tolerated. Encouraged proper weight management.  Important to get eight or more hours of sleep  Limiting the use of the computer and television before bedtime.  Decrease naps during the day, so night time sleep will become enhanced.  Limit caffeine, and sleep deprivation.

## 2023-06-25 NOTE — Progress Notes (Unsigned)
  Electrophysiology Office Note:    Date:  06/26/2023   ID:  Stacy Mclaughlin, DOB [birth year], MRN [id number]  [facility] Cardiologist:  None  [facility] Electrophysiologist:  Lanier Prude, MD   Referring MD: Debbe Odea, MD   Chief Complaint: Atrial fibrillation  History of Present Illness:    Stacy Mclaughlin is a 53 year old woman who I am seeing today for an evaluation of atrial fibrillation at the request of Dr. Azucena Cecil.  The patient has a history of obstructive sleep apnea.  She last saw Dr. Azucena Cecil June 19, 2023.  The patient was diagnosed with atrial fibrillation in the context of COVID infection.  Her burden on a heart monitor at the time of her COVID infection was around 1%.  Repeat heart monitor in January 2025 showed continued episodes of atrial fibrillation with an overall burden less than 1%.  She is on aspirin 81 mg by mouth once daily.  Ablation was discussed at her appoint with Dr. Azucena Cecil.    Their past medical, social and family history was reviewed.   ROS:   Please see the history of present illness.    All other systems reviewed and are negative.  EKGs/Labs/Other Studies Reviewed:    The following studies were reviewed today:  May 13, 2023 echo EF 55% RV normal Mild MR  May 24, 2023 ZIO monitor personally reviewed Less than 1% burden of atrial fibrillation, average heart rate 128-146  June 19, 2023 EKG shows sinus rhythm.  No preexcitation.       Physical Exam:    VS:  BP 126/80   Pulse (!) 58   Ht 5\' 8"  (1.727 m)   Wt 212 lb (96.2 kg)   SpO2 98%   BMI 32.23 kg/m     Wt Readings from Last 3 Encounters:  06/26/23 212 lb (96.2 kg)  06/19/23 213 lb 12.8 oz (97 kg)  06/19/23 214 lb 9.6 oz (97.3 kg)     GEN: no distress CARD: RRR, No MRG RESP: No IWOB. CTAB.        ASSESSMENT AND PLAN:    1. Paroxysmal atrial fibrillation (HCC)     #Paroxysmal atrial fibrillation Symptomatic.  Burden less  than 1%.  Discussed treatment options today for AF including antiarrhythmic drug therapy and ablation. Discussed risks, recovery and likelihood of success with each treatment strategy. Risk, benefits, and alternatives to EP study and ablation for afib were discussed. These risks include but are not limited to stroke, bleeding, vascular damage, tamponade, perforation, damage to the esophagus, lungs, phrenic nerve and other structures, pulmonary vein stenosis, worsening renal function, coronary vasospasm and death.  Discussed potential need for repeat ablation procedures and antiarrhythmic drugs after an initial ablation. The patient understands these risk and wishes to proceed.  We will therefore proceed with catheter ablation at the next available time.  Carto, ICE, anesthesia are requested for the procedure.  Will also obtain CT PV protocol prior to the procedure to exclude LAA thrombus and further evaluate atrial anatomy.  She will need to start Eliquis 4 weeks prior to the catheter ablation and continue for 3 months following ablation.      Signed, Rossie Muskrat. Lalla Brothers, MD, [facility], [facility] 06/26/2023 8:22 AM    Electrophysiology [facility]

## 2023-06-26 ENCOUNTER — Encounter: Payer: Self-pay | Admitting: Cardiology

## 2023-06-26 ENCOUNTER — Ambulatory Visit: Payer: BC Managed Care – PPO | Attending: Cardiology | Admitting: Cardiology

## 2023-06-26 ENCOUNTER — Other Ambulatory Visit: Payer: Self-pay

## 2023-06-26 VITALS — BP 126/80 | HR 58 | Ht 68.0 in | Wt 212.0 lb

## 2023-06-26 DIAGNOSIS — I480 Paroxysmal atrial fibrillation: Secondary | ICD-10-CM

## 2023-06-26 MED ORDER — APIXABAN 5 MG PO TABS: 5.0000 mg | ORAL_TABLET | Freq: Two times a day (BID) | ORAL | 3 refills | Status: DC

## 2023-06-26 NOTE — Patient Instructions (Signed)
Medication Instructions:  Your physician recommends that you continue on your current medications as directed. Please refer to the Current Medication list given to you today.  *If you need a refill on your cardiac medications before your next appointment, please call your pharmacy*  Lab Work: BMET and CBC - please have these done at any [facility] location prior to your procedure  Testing/Procedures: Cardiac CT Your physician has requested that you have cardiac CT. Cardiac computed tomography (CT) is a painless test that uses an x-ray machine to take clear, detailed pictures of your heart. For further information please visit [email or web address] Please follow instruction sheet as given. We will call you to schedule your CT scan. It will be done about three weeks prior to your ablation.   Ablation  Your physician has recommended that you have an ablation. Catheter ablation is a medical procedure used to treat some cardiac arrhythmias (irregular heartbeats). During catheter ablation, a long, thin, flexible tube is put into a blood vessel in your groin (upper thigh), or neck. This tube is called an ablation catheter. It is then guided to your heart through the blood vessel. Radio frequency waves destroy small areas of heart tissue where abnormal heartbeats may cause an arrhythmia to start. Please see the instruction sheet given to you today.   Follow-Up: At [facility], you and your health needs are our priority.  As part of our continuing mission to provide you with exceptional heart care, we have created designated Provider Care Teams.  These Care Teams include your primary Cardiologist (physician) and Advanced Practice Providers (APPs -  Physician Assistants and Nurse Practitioners) who all work together to provide you with the care you need, when you need it.  Your next appointment:   We will call you to schedule your follow up appointments

## 2023-06-27 DIAGNOSIS — G4730 Sleep apnea, unspecified: Secondary | ICD-10-CM | POA: Diagnosis not present

## 2023-06-27 DIAGNOSIS — G4733 Obstructive sleep apnea (adult) (pediatric): Secondary | ICD-10-CM

## 2023-07-03 ENCOUNTER — Telehealth (HOSPITAL_COMMUNITY): Payer: Self-pay

## 2023-07-03 NOTE — Telephone Encounter (Signed)
Spoke with patient to complete one month pre-procedure call.     New medical conditions?  No Recent hospitalizations or surgeries? No Started any new medications? No Patient made aware to contact office to inform of any new medications started. Any changes in activities of daily living? No  Pre-procedure testing scheduled: CT on 07/10/23 and lab work ordered.   Confirmed patient is taking Eliquis and will continue taking medication before procedure or it may need to be rescheduled.  Confirmed patient is scheduled for Atrial Fibrillation Ablation on Wednesday, March 19 with Dr. Steffanie Dunn. Instructed patient to arrive at the Main Entrance A at [facility]: [address] [place], [place] [address] and check in at Admitting at 9:00 AM  Advised of plan to go home the same day and will only stay overnight if medically necessary. You MUST have a responsible adult to drive you home and MUST be with you the first 24 hours after you arrive home or your procedure could be cancelled.  Patient verbalized understanding to information provided and is agreeable to proceed with procedure.

## 2023-07-03 NOTE — Telephone Encounter (Signed)
 Attempted to reach patient to discuss upcoming procedure, no answer. Left VM for patient to return call.

## 2023-07-09 DIAGNOSIS — I480 Paroxysmal atrial fibrillation: Secondary | ICD-10-CM | POA: Diagnosis not present

## 2023-07-10 ENCOUNTER — Ambulatory Visit
Admission: RE | Admit: 2023-07-10 | Discharge: 2023-07-10 | Disposition: A | Discharge status: Home / Self Care | Payer: BC Managed Care – PPO | Source: Ambulatory Visit | Attending: Cardiology | Admitting: Cardiology

## 2023-07-10 DIAGNOSIS — I480 Paroxysmal atrial fibrillation: Secondary | ICD-10-CM | POA: Diagnosis not present

## 2023-07-10 LAB — BASIC METABOLIC PANEL
BUN/Creatinine Ratio: 25 — ABNORMAL HIGH (ref 9–23)
BUN: 21 mg/dL (ref 6–24)
CO2: 23 mmol/L (ref 20–29)
Calcium: 9.7 mg/dL (ref 8.7–10.2)
Chloride: 103 mmol/L (ref 96–106)
Creatinine, Ser: 0.84 mg/dL (ref 0.57–1.00)
Glucose: 74 mg/dL (ref 70–99)
Potassium: 4.9 mmol/L (ref 3.5–5.2)
Sodium: 141 mmol/L (ref 134–144)
eGFR: 83 mL/min/{1.73_m2} (ref >59)

## 2023-07-10 LAB — CBC
Hematocrit: 41 % (ref 34.0–46.6)
Hemoglobin: 13.6 g/dL (ref 11.1–15.9)
MCH: 29.3 pg (ref 26.6–33.0)
MCHC: 33.2 g/dL (ref 31.5–35.7)
MCV: 88 fL (ref 79–97)
Platelets: 321 10*3/uL (ref 150–450)
RBC: 4.64 x10E6/uL (ref 3.77–5.28)
RDW: 12.4 % (ref 11.7–15.4)
WBC: 10.6 10*3/uL (ref 3.4–10.8)

## 2023-07-10 MED ORDER — IOHEXOL 350 MG/ML SOLN
100.0000 mL | Freq: Once | INTRAVENOUS | Status: AC | PRN
  Administered 2023-07-10: 100 mL via INTRAVENOUS

## 2023-07-10 MED ORDER — SODIUM CHLORIDE 0.9 % IV BOLUS
150.0000 mL | Freq: Once | INTRAVENOUS | Status: AC
  Administered 2023-07-10: 150 mL via INTRAVENOUS

## 2023-07-11 ENCOUNTER — Telehealth: Payer: Self-pay | Admitting: Pulmonary Disease

## 2023-07-11 DIAGNOSIS — Z0389 Encounter for observation for other suspected diseases and conditions ruled out: Secondary | ICD-10-CM | POA: Diagnosis not present

## 2023-07-11 NOTE — Telephone Encounter (Signed)
 Call patient  Sleep study result  Date of study:  06/27/2023  Impression: Negative study for significant sleep disordered breathing AHI of 1.3  Recommendation:  Clinical follow-up of symptoms  Encourage weight loss measures  An in lab study may be considered if there remains significant concern for sleep disordered breathing

## 2023-07-12 NOTE — Telephone Encounter (Signed)
 Lmtcb

## 2023-07-15 ENCOUNTER — Other Ambulatory Visit: Payer: Self-pay | Admitting: *Deleted

## 2023-07-15 NOTE — Telephone Encounter (Signed)
 Reviewed the patient's instruction letter for upcoming a-fib ablation. Per RN instructions to the patient: START taking Eliquis 5 mg twice daily and STOP taking Aspirin on Wednesday, February 19th.  Would not refill ASA at this time.   Thanks!

## 2023-07-16 NOTE — Telephone Encounter (Signed)
 Patient advised. Nothing further needed.

## 2023-07-17 ENCOUNTER — Encounter: Payer: Self-pay | Admitting: Cardiology

## 2023-07-17 ENCOUNTER — Other Ambulatory Visit: Payer: Self-pay | Admitting: *Deleted

## 2023-07-17 ENCOUNTER — Ambulatory Visit: Payer: BC Managed Care – PPO | Admitting: Internal Medicine

## 2023-07-17 DIAGNOSIS — I480 Paroxysmal atrial fibrillation: Secondary | ICD-10-CM

## 2023-07-18 ENCOUNTER — Other Ambulatory Visit: Payer: Self-pay | Admitting: Cardiology

## 2023-07-18 MED ORDER — APIXABAN 5 MG PO TABS
5.0000 mg | ORAL_TABLET | Freq: Two times a day (BID) | ORAL | 5 refills | Status: DC
Start: 2023-07-18 — End: 2023-11-07

## 2023-07-18 NOTE — Telephone Encounter (Signed)
 Prescription refill request for Eliquis received. Indication: Afib  Last office visit: 06/26/23 Lalla Brothers)  Scr: 0.84 (07/09/23)  Age: 53 Weight: 96.2kg  Appropriate dose. Refill sent.

## 2023-07-19 ENCOUNTER — Telehealth: Payer: Self-pay | Admitting: Cardiology

## 2023-07-19 NOTE — Telephone Encounter (Signed)
 Spoke with patient and she stated she missed placed her Eliquis. She will call the pharmacy and see if her insurance will let her do the one time missed RX to cover eliquis. She will give [place] a call back but she have missed yesterday dose and today dose

## 2023-07-19 NOTE — Telephone Encounter (Signed)
 Pt c/o medication issue:  1. Name of Medication: apixaban (ELIQUIS) 5 MG TABS tablet   2. How are you currently taking this medication (dosage and times per day)? As written  3. Are you having a reaction (difficulty breathing--STAT)? No   4. What is your medication issue? Pt has misplaced her bottle of medication

## 2023-07-25 ENCOUNTER — Telehealth (HOSPITAL_COMMUNITY): Payer: Self-pay

## 2023-07-25 NOTE — Telephone Encounter (Signed)
 Per Dr. Lalla Brothers, if she arrives in sinus rhythm can proceed without TEE. If arrives in atrial fibrillation, will need a TEE.   Notified patient of provider recommendations. She voiced understanding.   Informed Sherlynn Stalls., RN of possible TEE on morning of procedure.

## 2023-07-25 NOTE — Telephone Encounter (Signed)
 Call placed to patient to discuss upcoming procedure.   CT: completed and acceptable.  Labs: completed and acceptable.   Any recent signs of acute illness or been started on antibiotics? No Any medications to hold? No Any missed doses of blood thinner?  Reports she missed around 3 doses (1.5 day) of Eliquis on last week d/t misplacing her medication bottle while moving. Will update Dr. Lalla Brothers Advised patient to continue taking ANTICOAGULANT: Eliquis (Apixaban) without missing any doses.  Medication instructions:  On the morning of your procedure DO NOT take any medication., including Eliquis or the procedure may be rescheduled. Nothing to eat or drink after midnight prior to your procedure.  Confirmed patient is scheduled for Atrial Fibrillation Ablation on Wednesday, March 19 with Dr. Steffanie Dunn. Instructed patient to arrive at the Main Entrance A at [facility]: [address] [place], [place] [address] and check in at Admitting at 9:00 AM  Advised of plan to go home the same day and will only stay overnight if medically necessary. You MUST have a responsible adult to drive you home and MUST be with you the first 24 hours after you arrive home or your procedure could be cancelled.  Patient verbalized understanding to all instructions provided and agreed to proceed with procedure.

## 2023-07-30 NOTE — Pre-Procedure Instructions (Signed)
 Attempted to call patient regarding procedure instructions.  Left voicemail on the following items: Arrival time 0900 Nothing to eat or drink after midnight No meds AM of procedure Responsible person to drive you home and stay with you for 24 hrs  Have you missed any doses of anti-coagulant Eliquis- should be taken twice a day, if you have missed any doses please let us know.  Don't take dose morning of procedure.

## 2023-07-31 ENCOUNTER — Ambulatory Visit (HOSPITAL_COMMUNITY)

## 2023-07-31 ENCOUNTER — Ambulatory Visit (HOSPITAL_COMMUNITY)
Admission: RE | Disposition: A | Discharge status: Home / Self Care | Payer: Self-pay | Source: Home / Self Care | Attending: Cardiology

## 2023-07-31 ENCOUNTER — Encounter (HOSPITAL_COMMUNITY): Payer: Self-pay | Admitting: Cardiology

## 2023-07-31 ENCOUNTER — Ambulatory Visit (HOSPITAL_COMMUNITY)
Admission: RE | Admit: 2023-07-31 | Discharge: 2023-07-31 | Disposition: A | Discharge status: Home / Self Care | Payer: BC Managed Care – PPO | Attending: Cardiology | Admitting: Cardiology

## 2023-07-31 ENCOUNTER — Other Ambulatory Visit (HOSPITAL_COMMUNITY): Payer: Self-pay

## 2023-07-31 ENCOUNTER — Other Ambulatory Visit: Payer: Self-pay

## 2023-07-31 DIAGNOSIS — I4891 Unspecified atrial fibrillation: Secondary | ICD-10-CM | POA: Diagnosis not present

## 2023-07-31 DIAGNOSIS — I480 Paroxysmal atrial fibrillation: Secondary | ICD-10-CM | POA: Insufficient documentation

## 2023-07-31 DIAGNOSIS — Z7982 Long term (current) use of aspirin: Secondary | ICD-10-CM | POA: Diagnosis not present

## 2023-07-31 DIAGNOSIS — M1990 Unspecified osteoarthritis, unspecified site: Secondary | ICD-10-CM | POA: Diagnosis not present

## 2023-07-31 DIAGNOSIS — Z8616 Personal history of COVID-19: Secondary | ICD-10-CM | POA: Insufficient documentation

## 2023-07-31 HISTORY — PX: ATRIAL FIBRILLATION ABLATION: EP1191

## 2023-07-31 LAB — POCT ACTIVATED CLOTTING TIME: Activated Clotting Time: 302 s

## 2023-07-31 SURGERY — ATRIAL FIBRILLATION ABLATION
Anesthesia: General

## 2023-07-31 MED ORDER — FENTANYL CITRATE (PF) 250 MCG/5ML IJ SOLN
INTRAMUSCULAR | Status: DC | PRN
  Administered 2023-07-31: 100 ug via INTRAVENOUS

## 2023-07-31 MED ORDER — SODIUM CHLORIDE 0.9 % IV SOLN
INTRAVENOUS | Status: DC
Start: 2023-07-31 — End: 2023-07-31

## 2023-07-31 MED ORDER — HEPARIN (PORCINE) IN NACL 1000-0.9 UT/500ML-% IV SOLN
INTRAVENOUS | Status: DC | PRN
  Administered 2023-07-31 (×3): 500 mL

## 2023-07-31 MED ORDER — HEPARIN SODIUM (PORCINE) 1000 UNIT/ML IJ SOLN
INTRAMUSCULAR | Status: DC | PRN
  Administered 2023-07-31: 15000 [IU] via INTRAVENOUS
  Administered 2023-07-31: 5000 [IU] via INTRAVENOUS

## 2023-07-31 MED ORDER — SODIUM CHLORIDE 0.9 % IV SOLN: 250.0000 mL | INTRAVENOUS | Status: DC | PRN

## 2023-07-31 MED ORDER — ACETAMINOPHEN 325 MG PO TABS
ORAL_TABLET | ORAL | Status: AC
  Filled 2023-07-31: qty 2

## 2023-07-31 MED ORDER — MIDAZOLAM HCL 2 MG/2ML IJ SOLN
INTRAMUSCULAR | Status: DC | PRN
  Administered 2023-07-31: 2 mg via INTRAVENOUS

## 2023-07-31 MED ORDER — FENTANYL CITRATE (PF) 100 MCG/2ML IJ SOLN
INTRAMUSCULAR | Status: AC
Start: 2023-07-31 — End: ?
  Filled 2023-07-31: qty 2

## 2023-07-31 MED ORDER — DEXAMETHASONE SODIUM PHOSPHATE 10 MG/ML IJ SOLN
INTRAMUSCULAR | Status: DC | PRN
  Administered 2023-07-31: 10 mg via INTRAVENOUS

## 2023-07-31 MED ORDER — SODIUM CHLORIDE 0.9% FLUSH: 3.0000 mL | Freq: Two times a day (BID) | INTRAVENOUS | Status: DC

## 2023-07-31 MED ORDER — ONDANSETRON HCL 4 MG/2ML IJ SOLN: 4.0000 mg | Freq: Four times a day (QID) | INTRAMUSCULAR | Status: DC | PRN

## 2023-07-31 MED ORDER — SODIUM CHLORIDE 0.9% FLUSH: 3.0000 mL | INTRAVENOUS | Status: DC | PRN

## 2023-07-31 MED ORDER — ACETAMINOPHEN 325 MG PO TABS
650.0000 mg | ORAL_TABLET | ORAL | Status: DC | PRN
  Administered 2023-07-31: 650 mg via ORAL

## 2023-07-31 MED ORDER — MIDAZOLAM HCL 2 MG/2ML IJ SOLN
INTRAMUSCULAR | Status: AC
  Filled 2023-07-31: qty 2

## 2023-07-31 MED ORDER — PROTAMINE SULFATE 10 MG/ML IV SOLN
INTRAVENOUS | Status: DC | PRN
  Administered 2023-07-31: 35 mg via INTRAVENOUS

## 2023-07-31 MED ORDER — ROCURONIUM BROMIDE 10 MG/ML (PF) SYRINGE
PREFILLED_SYRINGE | INTRAVENOUS | Status: DC | PRN
  Administered 2023-07-31: 20 mg via INTRAVENOUS
  Administered 2023-07-31: 50 mg via INTRAVENOUS

## 2023-07-31 MED ORDER — LIDOCAINE 2% (20 MG/ML) 5 ML SYRINGE
INTRAMUSCULAR | Status: DC | PRN
  Administered 2023-07-31: 40 mg via INTRAVENOUS

## 2023-07-31 MED ORDER — SUGAMMADEX SODIUM 200 MG/2ML IV SOLN
INTRAVENOUS | Status: DC | PRN
  Administered 2023-07-31: 200 mg via INTRAVENOUS

## 2023-07-31 MED ORDER — ONDANSETRON HCL 4 MG/2ML IJ SOLN
INTRAMUSCULAR | Status: DC | PRN
  Administered 2023-07-31: 4 mg via INTRAVENOUS

## 2023-07-31 MED ORDER — PHENYLEPHRINE HCL-NACL 20-0.9 MG/250ML-% IV SOLN
INTRAVENOUS | Status: DC | PRN
  Administered 2023-07-31: 15 ug/min via INTRAVENOUS

## 2023-07-31 MED ORDER — APIXABAN 5 MG PO TABS
5.0000 mg | ORAL_TABLET | Freq: Two times a day (BID) | ORAL | Status: DC
  Administered 2023-07-31: 5 mg via ORAL
  Filled 2023-07-31: qty 1

## 2023-07-31 MED ORDER — ATROPINE SULFATE 1 MG/ML IV SOLN
INTRAVENOUS | Status: DC | PRN
  Administered 2023-07-31: 1 mg via INTRAVENOUS

## 2023-07-31 MED ORDER — PROPOFOL 10 MG/ML IV BOLUS
INTRAVENOUS | Status: DC | PRN
  Administered 2023-07-31: 200 mg via INTRAVENOUS

## 2023-07-31 MED ORDER — COLCHICINE 0.6 MG PO TABS
0.6000 mg | ORAL_TABLET | Freq: Two times a day (BID) | ORAL | Status: DC
  Administered 2023-07-31: 0.6 mg via ORAL
  Filled 2023-07-31 (×2): qty 1

## 2023-07-31 MED ORDER — PANTOPRAZOLE SODIUM 40 MG PO TBEC
40.0000 mg | DELAYED_RELEASE_TABLET | Freq: Every day | ORAL | Status: DC
  Administered 2023-07-31: 40 mg via ORAL
  Filled 2023-07-31: qty 1

## 2023-07-31 MED ORDER — EPHEDRINE SULFATE-NACL 50-0.9 MG/10ML-% IV SOSY
PREFILLED_SYRINGE | INTRAVENOUS | Status: DC | PRN
  Administered 2023-07-31 (×2): 10 mg via INTRAVENOUS

## 2023-07-31 MED ORDER — PANTOPRAZOLE SODIUM 40 MG PO TBEC
40.0000 mg | DELAYED_RELEASE_TABLET | Freq: Every day | ORAL | 0 refills | Status: DC
Start: 2023-07-31 — End: 2023-08-26
  Filled 2023-07-31: qty 45, 45d supply, fill #0

## 2023-07-31 MED ORDER — COLCHICINE 0.6 MG PO TABS
0.6000 mg | ORAL_TABLET | Freq: Two times a day (BID) | ORAL | 0 refills | Status: DC
  Filled 2023-07-31: qty 10, 5d supply, fill #0

## 2023-07-31 SURGICAL SUPPLY — 22 items
BAG SNAP BAND KOVER 36X36 (MISCELLANEOUS) IMPLANT
CABLE PFA RX CATH CONN (CABLE) IMPLANT
CATH FARAWAVE ABLATION 31 (CATHETERS) IMPLANT
CATH OCTARAY 2.0 F 3-3-3-3-3 (CATHETERS) IMPLANT
CATH SOUNDSTAR ECO 8FR (CATHETERS) IMPLANT
CATH WEBSTER BI DIR CS D-F CRV (CATHETERS) IMPLANT
CLOSURE PERCLOSE PROSTYLE (VASCULAR PRODUCTS) IMPLANT
COVER SWIFTLINK CONNECTOR (BAG) ×1 IMPLANT
DILATOR VESSEL 38 20CM 16FR (INTRODUCER) IMPLANT
GUIDEWIRE INQWIRE 1.5J.035X260 (WIRE) IMPLANT
INQWIRE 1.5J .035X260CM (WIRE) ×1 IMPLANT
KIT VERSACROSS CNCT FARADRIVE (KITS) IMPLANT
MAT PREVALON FULL STRYKER (MISCELLANEOUS) IMPLANT
PACK EP LF (CUSTOM PROCEDURE TRAY) ×1 IMPLANT
PAD DEFIB RADIO PHYSIO CONN (PAD) ×1 IMPLANT
PATCH CARTO3 (PAD) IMPLANT
SHEATH CATAPULT 8F 45 ST (SHEATH) IMPLANT
SHEATH FARADRIVE STEERABLE (SHEATH) IMPLANT
SHEATH PINNACLE 8F 10CM (SHEATH) IMPLANT
SHEATH PINNACLE 9F 10CM (SHEATH) IMPLANT
SHEATH PROBE COVER 6X72 (BAG) IMPLANT
WIRE HI TORQ VERSACORE-J 145CM (WIRE) IMPLANT

## 2023-07-31 NOTE — Anesthesia Procedure Notes (Signed)
 Procedure Name: Intubation Date/Time: 07/31/2023 11:05 AM  Performed by: April Holding, CRNAPre-anesthesia Checklist: Patient identified, Emergency Drugs available, Suction available and Patient being monitored Patient Re-evaluated:Patient Re-evaluated prior to induction Oxygen Delivery Method: Circle System Utilized Preoxygenation: Pre-oxygenation with 100% oxygen Induction Type: IV induction Ventilation: Mask ventilation without difficulty Laryngoscope Size: Miller and 2 Grade View: Grade II Tube type: Oral Tube size: 7.0 mm Number of attempts: 1 Airway Equipment and Method: Stylet and Bite block Placement Confirmation: ETT inserted through vocal cords under direct vision, positive ETCO2 and breath sounds checked- equal and bilateral Secured at: 22 cm Tube secured with: Tape Dental Injury: Teeth and Oropharynx as per pre-operative assessment

## 2023-07-31 NOTE — Progress Notes (Signed)
 Patient walked to the bathroom without difficulties. Bilateral groins level 0, clean, dry, and intact.

## 2023-07-31 NOTE — Transfer of Care (Signed)
 Immediate Anesthesia Transfer of Care Note  Patient: Stacy Mclaughlin  Procedure(s) Performed: ATRIAL FIBRILLATION ABLATION  Patient Location: Cath Lab  Anesthesia Type:General  Level of Consciousness: awake, alert , and oriented  Airway & Oxygen Therapy: Patient Spontanous Breathing and Patient connected to nasal cannula oxygen  Post-op Assessment: Report given to RN and Post -op Vital signs reviewed and stable  Post vital signs: Reviewed and stable  Last Vitals:  Vitals Value Taken Time  BP    Temp    Pulse 68 07/31/23 1250  Resp 15 07/31/23 1250  SpO2 95 % 07/31/23 1250  Vitals shown include unfiled device data.  Last Pain:  Vitals:   07/31/23 0922  TempSrc: Oral  PainSc: 0-No pain         Complications: No notable events documented.

## 2023-07-31 NOTE — Anesthesia Preprocedure Evaluation (Signed)
 Anesthesia Evaluation  Patient identified by MRN, date of birth, ID band Patient awake    Reviewed: Allergy & Precautions, NPO status , Patient's Chart, lab work & pertinent test results  Airway Mallampati: II  TM Distance: >3 FB Neck ROM: Full    Dental  (+) Dental Advisory Given   Pulmonary neg pulmonary ROS   breath sounds clear to auscultation       Cardiovascular + dysrhythmias Atrial Fibrillation  Rhythm:Irregular Rate:Normal     Neuro/Psych negative neurological ROS     GI/Hepatic negative GI ROS, Neg liver ROS,,,  Endo/Other  negative endocrine ROS    Renal/GU negative Renal ROS     Musculoskeletal  (+) Arthritis ,    Abdominal   Peds  Hematology negative hematology ROS (+)   Anesthesia Other Findings   Reproductive/Obstetrics                             Anesthesia Physical Anesthesia Plan  ASA: 2  Anesthesia Plan: General   Post-op Pain Management: Minimal or no pain anticipated   Induction: Intravenous  PONV Risk Score and Plan: 3 and Dexamethasone, Ondansetron, Midazolam and Treatment may vary due to age or medical condition  Airway Management Planned: Oral ETT  Additional Equipment: None  Intra-op Plan:   Post-operative Plan: Extubation in OR  Informed Consent: I have reviewed the patients History and Physical, chart, labs and discussed the procedure including the risks, benefits and alternatives for the proposed anesthesia with the patient or authorized representative who has indicated his/her understanding and acceptance.     Dental advisory given  Plan Discussed with: CRNA  Anesthesia Plan Comments:        Anesthesia Quick Evaluation

## 2023-07-31 NOTE — Discharge Instructions (Signed)

## 2023-07-31 NOTE — H&P (Signed)
 Electrophysiology Office Note:     Date:  07/31/2023    ID:  Stacy Mclaughlin, DOB 12-27-1970, MRN [id number]   [facility] Cardiologist:  None  [facility] Electrophysiologist:  Lanier Prude, MD    Referring MD: Debbe Odea, MD    Chief Complaint: Atrial fibrillation   History of Present Illness:     Stacy Mclaughlin is a 53 year old woman who I am seeing today for an evaluation of atrial fibrillation at the request of Dr. Azucena Cecil.  The patient has a history of obstructive sleep apnea.  She last saw Dr. Azucena Cecil June 19, 2023.   The patient was diagnosed with atrial fibrillation in the context of COVID infection.  Her burden on a heart monitor at the time of her COVID infection was around 1%.  Repeat heart monitor in January 2025 showed continued episodes of atrial fibrillation with an overall burden less than 1%.  She is on aspirin 81 mg by mouth once daily.  Ablation was discussed at her appoint with Dr. Azucena Cecil.  Presents for AF ablation. Procedure reviewed.   Objective Their past medical, social and family history was reviewed.     ROS:   Please see the history of present illness.    All other systems reviewed and are negative.   EKGs/Labs/Other Studies Reviewed:     The following studies were reviewed today:   May 13, 2023 echo EF 55% RV normal Mild MR   May 24, 2023 ZIO monitor personally reviewed Less than 1% burden of atrial fibrillation, average heart rate 128-146   June 19, 2023 EKG shows sinus rhythm.  No preexcitation.          Physical Exam:     VS:  BP 137/81   Pulse 59   Ht 5\' 8"  (1.727 m)   Wt 212 lb (96.2 kg)   SpO2 98%   BMI 32.23 kg/m         Wt Readings from Last 3 Encounters:  06/26/23 212 lb (96.2 kg)  06/19/23 213 lb 12.8 oz (97 kg)  06/19/23 214 lb 9.6 oz (97.3 kg)      GEN: no distress CARD: RRR, No MRG RESP: No IWOB. CTAB.         Assessment ASSESSMENT AND PLAN:     1. Paroxysmal  atrial fibrillation (HCC)       #Paroxysmal atrial fibrillation Symptomatic.  Burden less than 1%.   Discussed treatment options today for AF including antiarrhythmic drug therapy and ablation. Discussed risks, recovery and likelihood of success with each treatment strategy. Risk, benefits, and alternatives to EP study and ablation for afib were discussed. These risks include but are not limited to stroke, bleeding, vascular damage, tamponade, perforation, damage to the esophagus, lungs, phrenic nerve and other structures, pulmonary vein stenosis, worsening renal function, coronary vasospasm and death.  Discussed potential need for repeat ablation procedures and antiarrhythmic drugs after an initial ablation. The patient understands these risk and wishes to proceed.  We will therefore proceed with catheter ablation at the next available time.  Carto, ICE, anesthesia are requested for the procedure.  Will also obtain CT PV protocol prior to the procedure to exclude LAA thrombus and further evaluate atrial anatomy.   She will need to start Eliquis 4 weeks prior to the catheter ablation and continue for 3 months following ablation.    Presents for AF ablation today. Procedure reviewed.       Signed, Rossie Muskrat. Lalla Brothers, MD, [facility], [facility] 07/31/2023 Electrophysiology  [facility]

## 2023-08-01 ENCOUNTER — Telehealth (HOSPITAL_COMMUNITY): Payer: Self-pay

## 2023-08-01 ENCOUNTER — Encounter (HOSPITAL_COMMUNITY): Payer: Self-pay | Admitting: Cardiology

## 2023-08-01 MED FILL — Fentanyl Citrate Preservative Free (PF) Inj 100 MCG/2ML: INTRAMUSCULAR | Qty: 2 | Status: AC

## 2023-08-01 NOTE — Telephone Encounter (Signed)
 Spoke with patient to complete post procedure follow up call.  Patient reports no complications with groin sites.   Instructions reviewed with patient:  Remove large bandage at puncture site after 24 hours. It is normal to have bruising, tenderness and a pea or marble sized lump/knot at the groin site which can take up to three months to resolve.  Get help right away if you notice sudden swelling at the puncture site.  Check your puncture site every day for signs of infection: fever, redness, swelling, pus drainage, warmth, foul odor or excessive pain. If this occurs, please call the office at [phone], to speak with the nurse. Get help right away if your puncture site is bleeding and the bleeding does not stop after applying firm pressure to the area.  You may continue to have skipped beats/ atrial fibrillation during the first several months after your procedure.  It is very important not to miss any doses of your blood thinner Eliquis. Patient restarted taking this medication on yesterday, 07/31/23.   You will follow up with the APP on 08/26/23 and follow up with the APP on 10/30/23.   Patient verbalized understanding to all instructions provided.

## 2023-08-01 NOTE — Anesthesia Postprocedure Evaluation (Signed)
 Anesthesia Post Note  Patient: Stacy Mclaughlin  Procedure(s) Performed: ATRIAL FIBRILLATION ABLATION     Patient location during evaluation: PACU Anesthesia Type: General Level of consciousness: awake and alert Pain management: pain level controlled Vital Signs Assessment: post-procedure vital signs reviewed and stable Respiratory status: spontaneous breathing, nonlabored ventilation, respiratory function stable and patient connected to nasal cannula oxygen Cardiovascular status: blood pressure returned to baseline and stable Postop Assessment: no apparent nausea or vomiting Anesthetic complications: no   No notable events documented.  Last Vitals:  Vitals:   07/31/23 1502 07/31/23 1603  BP: 119/65 117/68  Pulse: 61 (!) 52  Resp: 17 (!) 21  Temp:    SpO2: 97% 97%    Last Pain:  Vitals:   07/31/23 1355  TempSrc:   PainSc: 4                  Kennieth Rad

## 2023-08-02 ENCOUNTER — Other Ambulatory Visit (HOSPITAL_COMMUNITY): Payer: Self-pay

## 2023-08-25 NOTE — Progress Notes (Unsigned)
 Electrophysiology Clinic Note    Date:  08/26/2023  Patient ID:  Stacy Mclaughlin, DOB [birth year], MRN [id number] PCP:  Alona Arrow, PA  Cardiologist:  None Electrophysiologist: Boyce Byes, MD    Discussed the use of AI scribe software for clinical note transcription with the patient, who gave verbal consent to proceed.   Patient Profile    Chief Complaint: AF ablation follow-up  History of Present Illness: Stacy Mclaughlin is a 52 y.o. female with PMH notable for parox AF; seen today for Boyce Byes, MD for routine electrophysiology followup.   She is s/p AF ablation w PVI, posterior wall on 07/31/2023 by Dr. Marven Slimmer.  On follow-up today, she has not had any AF episodes that she is aware of. She was working in the yard this past week and did have some episodes of brief palpitations. She wears apple watch and noted HR in 80s, low 100s during episodes. Did not feel like her previous AF episodes. She did not capture any of the EKGs during the episodes this past weekend.  She finished colchicine rx, and stopped protonix about a week ago.  Bilateral groin sites are completely healed without bruising.   She continues to take eliquis BID, no bleeding concerns.      Arrhythmia/Device History No specialty comments available.    ROS:  Please see the history of present illness. All other systems are reviewed and otherwise negative.    Physical Exam    VS:  BP 118/82   Pulse 63   Ht 5\' 8"  (1.727 m)   Wt 212 lb (96.2 kg)   SpO2 98%   BMI 32.23 kg/m  BMI: Body mass index is 32.23 kg/m.  Wt Readings from Last 3 Encounters:  08/26/23 212 lb (96.2 kg)  07/31/23 209 lb (94.8 kg)  06/26/23 212 lb (96.2 kg)     GEN- The patient is well appearing, alert and oriented x 3 today.   Lungs- Clear to ausculation bilaterally, normal work of breathing.  Heart- Regular rate and rhythm, no murmurs, rubs or gallops Extremities- No peripheral edema, warm,  dry    Studies Reviewed   Previous EP, cardiology notes.    EKG is ordered. Personal review of EKG from today shows:   EKG Interpretation Date/Time:  Monday August 26 2023 10:06:52 EDT Ventricular Rate:  63 PR Interval:  150 QRS Duration:  86 QT Interval:  402 QTC Calculation: 411 R Axis:   6  Text Interpretation: Normal sinus rhythm Normal ECG Confirmed by Jere Vanburen [id number] on 08/26/2023 10:10:49 AM    TTE, 05/13/2023  1. Left ventricular ejection fraction, by estimation, is 55 to 60%. The left ventricle has normal function. The left ventricle has no regional wall motion abnormalities. Left ventricular diastolic parameters were normal. The average left ventricular global longitudinal strain is -23.4 %. The global longitudinal strain is normal.   2. Right ventricular systolic function is normal. The right ventricular size is normal.   3. The mitral valve is normal in structure. Mild mitral valve regurgitation.   4. The aortic valve is tricuspid. Aortic valve regurgitation is not visualized.   5. The inferior vena cava is normal in size with greater than 50% respiratory variability, suggesting right atrial pressure of 3 mmHg.    Assessment and Plan     #) parox Afib #) palpitations S/p ablation 07/2023 by Dr. Marven Slimmer Recent palp episodes sound more PAC vs PVC given very short duration with normal HR  on apple watch I've asked her to try to capture EKG for review Continue regular physical activity She has stopped both colchicine and protonix  #) Hypercoag d/t parox afib CHA2DS2-VASc Score = at least 1 [CHF History: 0, HTN History: 0, Diabetes History: 0, Stroke History: 0, Vascular Disease History: 0, Age Score: 0, Gender Score: 1].  Therefore, the patient's annual risk of stroke is 0.6 %.    Stroke ppx - 5mg  eliquis BID, appropriately dosed No bleeding concerns Will plan to transition to 81mg  ASA at 3mon post-ablation appt          Current medicines are reviewed at  length with the patient today.   The patient does not have concerns regarding her medicines.  The following changes were made today:  none  Labs/ tests ordered today include:  Orders Placed This Encounter  Procedures   EKG 12-Lead     Disposition: Follow up with Dr. Marven Slimmer or EP APP  in 2 months    Signed, Adaline Holly, NP  08/26/23  10:29 AM  Electrophysiology [facility]

## 2023-08-26 ENCOUNTER — Encounter: Payer: Self-pay | Admitting: Cardiology

## 2023-08-26 ENCOUNTER — Ambulatory Visit: Attending: Cardiology | Admitting: Cardiology

## 2023-08-26 VITALS — BP 118/82 | HR 63 | Ht 68.0 in | Wt 212.0 lb

## 2023-08-26 DIAGNOSIS — I480 Paroxysmal atrial fibrillation: Secondary | ICD-10-CM | POA: Diagnosis not present

## 2023-08-26 DIAGNOSIS — D6869 Other thrombophilia: Secondary | ICD-10-CM

## 2023-08-26 NOTE — Patient Instructions (Signed)
 Medication Instructions:  The current medical regimen is effective;  continue present plan and medications as directed. Please refer to the Current Medication list given to you today.   *If you need a refill on your cardiac medications before your next appointment, please call your pharmacy*  Follow-Up: At [facility], you and your health needs are our priority.  As part of our continuing mission to provide you with exceptional heart care, our providers are all part of one team.  This team includes your primary Cardiologist (physician) and Advanced Practice Providers or APPs (Physician Assistants and Nurse Practitioners) who all work together to provide you with the care you need, when you need it.  Your next appointment:   Keep scheduled appointments    We recommend signing up for the patient portal called "MyChart".  Sign up information is provided on this After Visit Summary.  MyChart is used to connect with patients for Virtual Visits (Telemedicine).  Patients are able to view lab/test results, encounter notes, upcoming appointments, etc.  Non-urgent messages can be sent to your provider as well.   To learn more about what you can do with MyChart, go to [email or web address].

## 2023-09-24 ENCOUNTER — Ambulatory Visit (INDEPENDENT_AMBULATORY_CARE_PROVIDER_SITE_OTHER): Payer: Self-pay | Admitting: Oncology

## 2023-09-24 DIAGNOSIS — R350 Frequency of micturition: Secondary | ICD-10-CM

## 2023-09-24 LAB — POCT URINALYSIS DIPSTICK (MANUAL)
Nitrite, UA: NEGATIVE
Poct Blood: 250 — AB
Poct Glucose: NORMAL mg/dL
Poct Ketones: NEGATIVE
Poct Urobilinogen: NORMAL mg/dL
Spec Grav, UA: 1.02 (ref 1.010–1.025)
pH, UA: 6 (ref 5.0–8.0)

## 2023-09-24 MED ORDER — NITROFURANTOIN MONOHYD MACRO 100 MG PO CAPS: 100.0000 mg | ORAL_CAPSULE | Freq: Two times a day (BID) | ORAL | 0 refills | Status: DC

## 2023-09-24 NOTE — Progress Notes (Signed)
 [facility]  [address] [place], [place] [address] Phone: [phone] Fax: [phone]   Office Visit Note  Patient Name: Stacy Mclaughlin  Date of UXLKG:[id number]  Med Rec number [id number]  Date of Service: 09/24/2023  Sulfamethoxazole-trimethoprim  No chief complaint on file.  HPI Patient is an 53 y.o. female who is here for possible UTI. Onset was a few weeks ago.  Symptoms have waxed and waned.  Reports symptoms of increased urinary frequency, small amounts of urine and some burning at the end of her stream.  Denies any back or pelvic pain.  Reports she did her own UTI test at home and it was inconclusive.  She has been drinking a lot of water to try to flush it.  She has had a UTI in the past but it has been many years.  Has allergy to Bactrim.    Current Medication:  Outpatient Encounter Medications as of 09/24/2023  Medication Sig   nitrofurantoin, macrocrystal-monohydrate, (MACROBID) 100 MG capsule Take 1 capsule (100 mg total) by mouth 2 (two) times daily.   apixaban  (ELIQUIS ) 5 MG TABS tablet Take 1 tablet (5 mg total) by mouth 2 (two) times daily.   ESTROVERA 4 MG TABS Take 4 mg by mouth daily.   progesterone (PROMETRIUM) 200 MG capsule Take 200 mg by mouth daily.   Zinc 50 MG TABS Take 50 mg by mouth daily.   No facility-administered encounter medications on file as of 09/24/2023.      Medical History: Past Medical History:  Diagnosis Date   Heart murmur    Memory loss    Seasonal allergies      Vital Signs: There were no vitals taken for this visit.  ROS: As per HPI.  All other pertinent ROS negative.     Review of Systems  Genitourinary:  Positive for decreased urine volume, dysuria, frequency and urgency.   Physical Exam Vitals reviewed.  Constitutional:      Appearance: Normal appearance.  Abdominal:     Tenderness: There is no right CVA tenderness or left CVA tenderness.  Neurological:     Mental Status: She is alert.      Results for orders placed or performed in visit on 09/24/23 (from the past 24 hours)  POCT Urinalysis Dip Manual     Status: Abnormal   Collection Time: 09/24/23  9:03 AM  Result Value Ref Range   Spec Grav, UA 1.020 1.010 - 1.025   pH, UA 6.0 5.0 - 8.0   Leukocytes, UA Moderate (2+) (A) Negative   Nitrite, UA Negative Negative   Poct Protein trace Negative, trace mg/dL   Poct Glucose Normal Normal mg/dL   Poct Ketones Negative Negative   Poct Urobilinogen Normal Normal mg/dL   Poct Bilirubin + (A) Negative   Poct Blood =250 (A) Negative, trace    Assessment/Plan: 1. Increased urinary frequency (Primary) Urinalysis showed moderate amount of leukocytes, blood and bilirubin.  Will send for culture.  Given symptoms, we will go ahead and treat with Macrobid 100 mg twice a day x 7 days.  Drink plenty of water.  May use OTC Azo for dysuria.  Will send results via MyChart of culture.  - nitrofurantoin, macrocrystal-monohydrate, (MACROBID) 100 MG capsule; Take 1 capsule (100 mg total) by mouth 2 (two) times daily.  Dispense: 14 capsule; Refill: 0 - POCT Urinalysis Dip Manual - Urine Culture  General Counseling: anecia serven understanding of the findings of todays visit and agrees with plan  of treatment. I have discussed any further diagnostic evaluation that may be needed or ordered today. We also reviewed her medications today. she has been encouraged to call the office with any questions or concerns that should arise related to todays visit.   Orders Placed This Encounter  Procedures   Urine Culture   POCT Urinalysis Dip Manual    Meds ordered this encounter  Medications   nitrofurantoin, macrocrystal-monohydrate, (MACROBID) 100 MG capsule    Sig: Take 1 capsule (100 mg total) by mouth 2 (two) times daily.    Dispense:  14 capsule    Refill:  0    I spent 20 minutes dedicated to the care of this patient (face-to-face and non-face-to-face) on the date of the encounter to  include what is described in the assessment and plan.   Charlton Cooler, NP 09/24/2023 10:30 AM

## 2023-09-27 ENCOUNTER — Ambulatory Visit: Payer: Self-pay | Admitting: Oncology

## 2023-09-27 LAB — URINE CULTURE

## 2023-10-30 ENCOUNTER — Ambulatory Visit: Admitting: Cardiology

## 2023-11-06 NOTE — Progress Notes (Unsigned)
 Electrophysiology Clinic Note    Date:  11/07/2023  Patient ID:  Stacy Mclaughlin, Rezek [birth year], MRN [id number] PCP:  Stacia Millman, PA  Cardiologist:  None Electrophysiologist: OLE ONEIDA HOLTS, MD    Discussed the use of AI scribe software for clinical note transcription with the patient, who gave verbal consent to proceed.   Patient Profile    Chief Complaint: AF ablation follow-up  History of Present Illness: Stacy Mclaughlin is a 53 y.o. female with PMH notable for parox AF; seen today for OLE ONEIDA HOLTS, MD for routine electrophysiology followup.   She is s/p AF ablation w isolation of pulm veins, posterior wall on 07/31/2023 by Dr. HOLTS. I saw her for routine 1 month post-ablation appt where she had not had any AF episodes, had brief palp episode thought maybe PAC vs PVCs.   On follow-up today, she has not had any episodes of Afib. She wears apple watch that also confirms this.  She has continued to take eliquis  BID without bleeding concerns.     Arrhythmia/Device History No specialty comments available.    ROS:  Please see the history of present illness. All other systems are reviewed and otherwise negative.    Physical Exam    VS:  BP 108/78 (BP Location: Left Arm, Patient Position: Sitting, Cuff Size: Normal)   Pulse 64   Ht 5' 8 (1.727 m)   Wt 214 lb (97.1 kg)   SpO2 96%   BMI 32.54 kg/m  BMI: Body mass index is 32.54 kg/m.  Wt Readings from Last 3 Encounters:  11/07/23 214 lb (97.1 kg)  08/26/23 212 lb (96.2 kg)  07/31/23 209 lb (94.8 kg)     GEN- The patient is well appearing, alert and oriented x 3 today.   Lungs- Clear to ausculation bilaterally, normal work of breathing.  Heart- Regular rate and rhythm, no murmurs, rubs or gallops Extremities- No peripheral edema, warm, dry    Studies Reviewed   Previous EP, cardiology notes.    EKG is ordered. Personal review of EKG from today shows:   EKG  Interpretation Date/Time:  Thursday November 07 2023 12:24:44 EDT Ventricular Rate:  64 PR Interval:  150 QRS Duration:  84 QT Interval:  392 QTC Calculation: 404 R Axis:   -14  Text Interpretation: Normal sinus rhythm Normal ECG Confirmed by Nadia Viar [phone]) on 11/07/2023 12:27:47 PM    TTE, 05/13/2023  1. Left ventricular ejection fraction, by estimation, is 55 to 60%. The left ventricle has normal function. The left ventricle has no regional wall motion abnormalities. Left ventricular diastolic parameters were normal. The average left ventricular global longitudinal strain is -23.4 %. The global longitudinal strain is normal.   2. Right ventricular systolic function is normal. The right ventricular size is normal.   3. The mitral valve is normal in structure. Mild mitral valve regurgitation.   4. The aortic valve is tricuspid. Aortic valve regurgitation is not visualized.   5. The inferior vena cava is normal in size with greater than 50% respiratory variability, suggesting right atrial pressure of 3 mmHg.    Assessment and Plan     #) parox Afib S/p AF ablation 07/2023 by Dr. HOLTS No AFib episodes Continue regular physical activity   #) Hypercoag d/t parox afib CHA2DS2-VASc Score = at least 1 [CHF History: 0, HTN History: 0, Diabetes History: 0, Stroke History: 0, Vascular Disease History: 0, Age Score: 0, Gender Score: 1].  Therefore, the patient's  annual risk of stroke is 0.6 %.    Stroke ppx - 5mg  eliquis  BID, appropriately dosed No bleeding concerns Given low chads vasc score and monitoring strategy with apple watch, we discussed stopping eliquis , which she is agreeable to Start 81mg  ASA daily      Current medicines are reviewed at length with the patient today.   The patient does not have concerns regarding her medicines.  The following changes were made today:   STOP eliquis  START 81mg  aspirin   Labs/ tests ordered today include:  Orders Placed This Encounter   Procedures   EKG 12-Lead     Disposition: Follow up with Dr. Cindie or EP APP or Dr. Budd Kindle in 12 months. If no episodes of AFib, would also be reasonable to follow-up PRN   Signed, Dhanvi Boesen, NP  11/07/23  12:40 PM  Electrophysiology [facility]

## 2023-11-07 ENCOUNTER — Ambulatory Visit: Attending: Cardiology | Admitting: Cardiology

## 2023-11-07 VITALS — BP 108/78 | HR 64 | Ht 68.0 in | Wt 214.0 lb

## 2023-11-07 DIAGNOSIS — D6869 Other thrombophilia: Secondary | ICD-10-CM

## 2023-11-07 DIAGNOSIS — I480 Paroxysmal atrial fibrillation: Secondary | ICD-10-CM | POA: Diagnosis not present

## 2023-11-07 MED ORDER — ASPIRIN 81 MG PO TBEC: 81.0000 mg | DELAYED_RELEASE_TABLET | Freq: Every day | ORAL | Status: DC

## 2023-11-07 NOTE — Patient Instructions (Signed)
 Medication Instructions:  Your physician recommends that you continue on your current medications as directed. Please refer to the Current Medication list given to you today.   *If you need a refill on your cardiac medications before your next appointment, please call your pharmacy*  Lab Work: None ordered at this time   Follow-Up: At [facility], you and your health needs are our priority.  As part of our continuing mission to provide you with exceptional heart care, our providers are all part of one team.  This team includes your primary Cardiologist (physician) and Advanced Practice Providers or APPs (Physician Assistants and Nurse Practitioners) who all work together to provide you with the care you need, when you need it.  Your next appointment:   1 year(s)  Provider:   Suzann Riddle, NP

## 2023-11-20 DIAGNOSIS — Z01419 Encounter for gynecological examination (general) (routine) without abnormal findings: Secondary | ICD-10-CM | POA: Diagnosis not present

## 2023-11-20 DIAGNOSIS — Z1330 Encounter for screening examination for mental health and behavioral disorders, unspecified: Secondary | ICD-10-CM | POA: Diagnosis not present

## 2023-11-20 DIAGNOSIS — Z124 Encounter for screening for malignant neoplasm of cervix: Secondary | ICD-10-CM | POA: Diagnosis not present

## 2023-12-08 ENCOUNTER — Other Ambulatory Visit: Payer: Self-pay | Admitting: Cardiology

## 2024-01-22 DIAGNOSIS — Z0000 Encounter for general adult medical examination without abnormal findings: Secondary | ICD-10-CM | POA: Diagnosis not present

## 2024-01-22 DIAGNOSIS — I480 Paroxysmal atrial fibrillation: Secondary | ICD-10-CM | POA: Diagnosis not present

## 2024-01-30 ENCOUNTER — Encounter: Payer: Self-pay | Admitting: *Deleted

## 2024-02-05 ENCOUNTER — Ambulatory Visit: Admitting: Nurse Practitioner
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
## 2024-02-10 ENCOUNTER — Ambulatory Visit (INDEPENDENT_AMBULATORY_CARE_PROVIDER_SITE_OTHER): Payer: Self-pay | Admitting: Medical

## 2024-02-10 ENCOUNTER — Other Ambulatory Visit: Payer: Self-pay

## 2024-02-10 ENCOUNTER — Encounter: Payer: Self-pay | Admitting: Medical

## 2024-02-10 VITALS — BP 110/70 | HR 65 | Temp 98.8°F | Ht 68.0 in | Wt 220.0 lb

## 2024-02-10 DIAGNOSIS — R051 Acute cough: Secondary | ICD-10-CM

## 2024-02-10 DIAGNOSIS — J069 Acute upper respiratory infection, unspecified: Secondary | ICD-10-CM

## 2024-02-10 LAB — POC COVID19/FLU A&B COMBO
Covid Antigen, POC: NEGATIVE
Influenza A Antigen, POC: NEGATIVE
Influenza B Antigen, POC: NEGATIVE

## 2024-02-10 NOTE — Progress Notes (Signed)
 [facility] [address] [place], [place] [address]   Office Visit Note  Patient Name: Stacy Mclaughlin Date of Birth [id number]  Medical Record number [id number]  Date of Service: 02/10/2024  Chief Complaint  Patient presents with   Sick visit    Patient c/o a dry cough, headache, and SOB with exertion. Symptoms began 3 days ago. She has not take any OTC meds for current symptoms. She has an albuterol  inhaler at home but has not used it.     HPI 53 y.o. female presents with dry cough, headache, feeling warm and feeling a little short of breath.   Sx began 2 days ago. Feels harder to take a deep breath, chest feels heavy. Has been fatigued. Has a scratchy throat. No nasal congestion, runny nose or myalgias. Had diarrhea yesterday, none today so far. No nausea or vomiting.  No hx of asthma. Has been given an albuterol  inhaler to use in past for lingering cough. Has never been a smoker. Does not vape.  Family member (step mother) recently in hospital following stroke, now home on hospice. Has been in hospital visiting some. No known sick contacts/exposures. Was at children's birthday party 2 days ago.   No recent COVID vaccines.   No OTC tx so far.    Current Medication:  Outpatient Encounter Medications as of 02/10/2024  Medication Sig   albuterol  (VENTOLIN  HFA) 108 (90 Base) MCG/ACT inhaler Inhale into the lungs every 6 (six) hours as needed for wheezing or shortness of breath.   ASPIRIN  LOW DOSE 81 MG tablet TAKE 1 TABLET (81 MG TOTAL) BY MOUTH DAILY. SWALLOW WHOLE. (Patient taking differently: Take 81 mg by mouth daily. SWALLOW WHOLE.)   cetirizine  (ZYRTEC ) 5 MG chewable tablet Chew 5 mg by mouth daily.   cholecalciferol (VITAMIN D3) 25 MCG (1000 UNIT) tablet Take 1,000 Units by mouth daily.   ESTROVERA 4 MG TABS Take 4 mg by mouth daily.   Multiple Vitamin (MULTIVITAMIN WITH MINERALS) TABS tablet Take 1 tablet by mouth daily.   progesterone (PROMETRIUM)  200 MG capsule Take 200 mg by mouth daily.   No facility-administered encounter medications on file as of 02/10/2024.      Medical History: Past Medical History:  Diagnosis Date   Heart murmur    Memory loss    Seasonal allergies      Vital Signs: BP 110/70   Pulse 65   Temp 98.8 F (37.1 C)   Ht 5' 8 (1.727 m)   Wt 220 lb (99.8 kg)   SpO2 98%   BMI 33.45 kg/m    Review of Systems  Constitutional:  Positive for fatigue. Negative for chills and fever (Has felt warmer than usual/flushed).  HENT:  Positive for sore throat. Negative for congestion, rhinorrhea and trouble swallowing.   Respiratory:  Positive for cough and shortness of breath. Negative for chest tightness (Chest does feel heavy).   Cardiovascular:  Negative for chest pain.  Gastrointestinal:  Positive for diarrhea. Negative for nausea and vomiting.  Musculoskeletal:  Negative for myalgias and neck stiffness.  Neurological:  Positive for headaches.    Physical Exam Vitals reviewed.  Constitutional:      General: She is not in acute distress.    Appearance: She is not ill-appearing.     Comments: Tired appearing  HENT:     Head: Normocephalic.     Right Ear: Ear canal and external ear normal.     Left Ear: Ear canal and  external ear normal.     Ears:     Comments: TMs dull bilaterally    Nose: Mucosal edema present. No congestion or rhinorrhea.     Mouth/Throat:     Mouth: Mucous membranes are moist. No oral lesions.     Pharynx: Uvula midline. Posterior oropharyngeal erythema (mild) present. No pharyngeal swelling or oropharyngeal exudate.     Comments: Tonsils surgically absent. Cardiovascular:     Rate and Rhythm: Normal rate and regular rhythm.     Heart sounds: No murmur heard.    No friction rub. No gallop.  Pulmonary:     Effort: Pulmonary effort is normal.     Breath sounds: Normal breath sounds. No wheezing, rhonchi or rales.     Comments: Occasional dry cough Musculoskeletal:      Cervical back: Neck supple. No rigidity.  Lymphadenopathy:     Cervical: No cervical adenopathy.  Neurological:     Mental Status: She is alert.     Results for orders placed or performed in visit on 02/10/24 (from the past 24 hours)  POC Covid19/Flu A&B Antigen     Status: None   Collection Time: 02/10/24  1:02 PM  Result Value Ref Range   Influenza A Antigen, POC Negative Negative   Influenza B Antigen, POC Negative Negative   Covid Antigen, POC Negative Negative     Assessment/Plan: 1. Acute upper respiratory infection (Primary) 2. Acute cough Suspect early viral infection. Discussed repeating at-home COVID-19 antigen test in 48 hours. May try albuterol  inhaler as needed for shortness of breath/chest heaviness. Discussed supportive measures and OTC symptomatic treatment. Discussed infection control measures. Encouraged to call, message provider or schedule follow up visit as needed for new/worsening symptoms.  - POC Covid19/Flu A&B Antigen   Patient Instructions  -Rest and stay well hydrated (by drinking water and other liquids).  -Consider repeating an at-home COVID-19 antigen test in 48 hours. -Take over-the-counter medicines (i.e. Robitussin, Tylenol ) to help relieve your symptoms. -Use albuterol  inhaler if needed for shortness of breath, chest discomfort or persistent cough. -For your sore throat/cough, use cough drops/throat lozenges, gargle warm salt water and/or drink warm liquids (like tea with honey). -Call, send message to provider or schedule return visit as needed for new/worsening symptoms (i.e. increased shortness of breath or chest discomfort, high fever) or if symptoms do not improve as discussed with recommended treatment over the next 5-7 days.   General Counseling: ayanni tun understanding of the findings of todays visit and plan of treatment. she has been encouraged to call the office with any questions or concerns that should arise related to todays  visit.    Time spent: 20 Minutes    Joen Arts PA-C Physician Assistant

## 2024-02-10 NOTE — Patient Instructions (Signed)
-  Rest and stay well hydrated (by drinking water and other liquids).  -Consider repeating an at-home COVID-19 antigen test in 48 hours. -Take over-the-counter medicines (i.e. Robitussin, Tylenol ) to help relieve your symptoms. -Use albuterol  inhaler if needed for shortness of breath, chest discomfort or persistent cough. -For your sore throat/cough, use cough drops/throat lozenges, gargle warm salt water and/or drink warm liquids (like tea with honey). -Call, send message to provider or schedule return visit as needed for new/worsening symptoms (i.e. increased shortness of breath or chest discomfort, high fever) or if symptoms do not improve as discussed with recommended treatment over the next 5-7 days.

## 2024-03-10 DIAGNOSIS — Z1231 Encounter for screening mammogram for malignant neoplasm of breast: Secondary | ICD-10-CM | POA: Diagnosis not present

## 2024-03-12 ENCOUNTER — Ambulatory Visit (INDEPENDENT_AMBULATORY_CARE_PROVIDER_SITE_OTHER): Payer: Self-pay

## 2024-03-12 DIAGNOSIS — Z23 Encounter for immunization: Secondary | ICD-10-CM
